# Patient Record
Sex: Female | Born: 1945 | Race: White | Hispanic: No | Marital: Married | Smoking: Never smoker
Health system: Southern US, Community
[De-identification: ages and names within clinical notes are randomized; demographics above are authoritative.]

## PROBLEM LIST (undated history)

## (undated) DIAGNOSIS — I2510 Atherosclerotic heart disease of native coronary artery without angina pectoris: Secondary | ICD-10-CM

## (undated) HISTORY — PX: ABDOMINAL HYSTERECTOMY: SHX81

## (undated) HISTORY — PX: KNEE ARTHROSCOPY: SUR90

---

## 2015-01-31 ENCOUNTER — Other Ambulatory Visit: Payer: Self-pay | Admitting: Orthopaedic Surgery

## 2015-01-31 ENCOUNTER — Ambulatory Visit
Admission: RE | Admit: 2015-01-31 | Discharge: 2015-01-31 | Disposition: A | Discharge status: Home / Self Care | Payer: PRIVATE HEALTH INSURANCE | Source: Ambulatory Visit | Attending: Orthopaedic Surgery | Admitting: Orthopaedic Surgery

## 2015-01-31 DIAGNOSIS — M25561 Pain in right knee: Secondary | ICD-10-CM

## 2015-04-09 ENCOUNTER — Emergency Department (HOSPITAL_COMMUNITY): Payer: Medicare Other

## 2015-04-09 ENCOUNTER — Encounter (HOSPITAL_COMMUNITY): Payer: Self-pay | Admitting: Emergency Medicine

## 2015-04-09 ENCOUNTER — Observation Stay (HOSPITAL_COMMUNITY)
Admission: EM | Admit: 2015-04-09 | Discharge: 2015-04-11 | Disposition: A | Discharge status: Skilled Nursing Facility | Payer: Medicare Other | Attending: Internal Medicine | Admitting: Internal Medicine

## 2015-04-09 DIAGNOSIS — M179 Osteoarthritis of knee, unspecified: Secondary | ICD-10-CM | POA: Diagnosis not present

## 2015-04-09 DIAGNOSIS — Z882 Allergy status to sulfonamides status: Secondary | ICD-10-CM | POA: Insufficient documentation

## 2015-04-09 DIAGNOSIS — Z91040 Latex allergy status: Secondary | ICD-10-CM | POA: Insufficient documentation

## 2015-04-09 DIAGNOSIS — E669 Obesity, unspecified: Secondary | ICD-10-CM | POA: Diagnosis not present

## 2015-04-09 DIAGNOSIS — Z885 Allergy status to narcotic agent status: Secondary | ICD-10-CM | POA: Insufficient documentation

## 2015-04-09 DIAGNOSIS — E119 Type 2 diabetes mellitus without complications: Secondary | ICD-10-CM

## 2015-04-09 DIAGNOSIS — R0602 Shortness of breath: Secondary | ICD-10-CM

## 2015-04-09 DIAGNOSIS — T8859XA Other complications of anesthesia, initial encounter: Secondary | ICD-10-CM | POA: Insufficient documentation

## 2015-04-09 DIAGNOSIS — I2510 Atherosclerotic heart disease of native coronary artery without angina pectoris: Secondary | ICD-10-CM | POA: Diagnosis not present

## 2015-04-09 DIAGNOSIS — G9340 Encephalopathy, unspecified: Secondary | ICD-10-CM | POA: Diagnosis not present

## 2015-04-09 DIAGNOSIS — R05 Cough: Secondary | ICD-10-CM | POA: Diagnosis not present

## 2015-04-09 DIAGNOSIS — E1140 Type 2 diabetes mellitus with diabetic neuropathy, unspecified: Secondary | ICD-10-CM | POA: Insufficient documentation

## 2015-04-09 DIAGNOSIS — T4145XA Adverse effect of unspecified anesthetic, initial encounter: Secondary | ICD-10-CM | POA: Insufficient documentation

## 2015-04-09 DIAGNOSIS — Z6834 Body mass index (BMI) 34.0-34.9, adult: Secondary | ICD-10-CM | POA: Insufficient documentation

## 2015-04-09 DIAGNOSIS — Z7984 Long term (current) use of oral hypoglycemic drugs: Secondary | ICD-10-CM | POA: Insufficient documentation

## 2015-04-09 DIAGNOSIS — Z888 Allergy status to other drugs, medicaments and biological substances status: Secondary | ICD-10-CM | POA: Insufficient documentation

## 2015-04-09 DIAGNOSIS — I10 Essential (primary) hypertension: Secondary | ICD-10-CM | POA: Diagnosis present

## 2015-04-09 DIAGNOSIS — N179 Acute kidney failure, unspecified: Secondary | ICD-10-CM | POA: Diagnosis not present

## 2015-04-09 DIAGNOSIS — D509 Iron deficiency anemia, unspecified: Secondary | ICD-10-CM | POA: Diagnosis present

## 2015-04-09 DIAGNOSIS — Z794 Long term (current) use of insulin: Secondary | ICD-10-CM | POA: Insufficient documentation

## 2015-04-09 DIAGNOSIS — R4182 Altered mental status, unspecified: Secondary | ICD-10-CM | POA: Diagnosis present

## 2015-04-09 DIAGNOSIS — R509 Fever, unspecified: Secondary | ICD-10-CM | POA: Diagnosis present

## 2015-04-09 HISTORY — DX: Atherosclerotic heart disease of native coronary artery without angina pectoris: I25.10

## 2015-04-09 LAB — I-STAT VENOUS BLOOD GAS, ED
Acid-base deficit: 1 mmol/L (ref 0.0–2.0)
Bicarbonate: 25.2 mEq/L — ABNORMAL HIGH (ref 20.0–24.0)
O2 SAT: 92 %
PCO2 VEN: 47.2 mmHg (ref 45.0–50.0)
PO2 VEN: 70 mmHg — AB (ref 30.0–45.0)
TCO2: 27 mmol/L (ref 0–100)
pH, Ven: 7.335 — ABNORMAL HIGH (ref 7.250–7.300)

## 2015-04-09 LAB — CBC WITH DIFFERENTIAL/PLATELET
Basophils Absolute: 0 10*3/uL (ref 0.0–0.1)
Basophils Relative: 0 %
Eosinophils Absolute: 0.1 10*3/uL (ref 0.0–0.7)
Eosinophils Relative: 1 %
HEMATOCRIT: 37.2 % (ref 36.0–46.0)
HEMOGLOBIN: 10.9 g/dL — AB (ref 12.0–15.0)
LYMPHS ABS: 4.3 10*3/uL — AB (ref 0.7–4.0)
LYMPHS PCT: 33 %
MCH: 22.1 pg — ABNORMAL LOW (ref 26.0–34.0)
MCHC: 29.3 g/dL — AB (ref 30.0–36.0)
MCV: 75.3 fL — ABNORMAL LOW (ref 78.0–100.0)
MONOS PCT: 8 %
Monocytes Absolute: 1 10*3/uL (ref 0.1–1.0)
NEUTROS ABS: 7.7 10*3/uL (ref 1.7–7.7)
NEUTROS PCT: 58 %
Platelets: 242 10*3/uL (ref 150–400)
RBC: 4.94 MIL/uL (ref 3.87–5.11)
RDW: 17.7 % — ABNORMAL HIGH (ref 11.5–15.5)
WBC: 13.1 10*3/uL — ABNORMAL HIGH (ref 4.0–10.5)

## 2015-04-09 LAB — COMPREHENSIVE METABOLIC PANEL
ALT: 45 U/L (ref 14–54)
ANION GAP: 10 (ref 5–15)
AST: 49 U/L — ABNORMAL HIGH (ref 15–41)
Albumin: 3.2 g/dL — ABNORMAL LOW (ref 3.5–5.0)
Alkaline Phosphatase: 101 U/L (ref 38–126)
BUN: 15 mg/dL (ref 6–20)
CALCIUM: 8.6 mg/dL — AB (ref 8.9–10.3)
CHLORIDE: 102 mmol/L (ref 101–111)
CO2: 25 mmol/L (ref 22–32)
Creatinine, Ser: 1.53 mg/dL — ABNORMAL HIGH (ref 0.44–1.00)
GFR calc non Af Amer: 34 mL/min — ABNORMAL LOW (ref >60)
GFR, EST AFRICAN AMERICAN: 39 mL/min — AB (ref >60)
Glucose, Bld: 69 mg/dL (ref 65–99)
POTASSIUM: 4.9 mmol/L (ref 3.5–5.1)
SODIUM: 137 mmol/L (ref 135–145)
Total Bilirubin: 0.5 mg/dL (ref 0.3–1.2)
Total Protein: 6.4 g/dL — ABNORMAL LOW (ref 6.5–8.1)

## 2015-04-09 LAB — URINALYSIS, ROUTINE W REFLEX MICROSCOPIC
BILIRUBIN URINE: NEGATIVE
Glucose, UA: NEGATIVE mg/dL
Hgb urine dipstick: NEGATIVE
Ketones, ur: NEGATIVE mg/dL
LEUKOCYTES UA: NEGATIVE
NITRITE: NEGATIVE
PH: 5.5 (ref 5.0–8.0)
Protein, ur: 100 mg/dL — AB
Specific Gravity, Urine: 1.014 (ref 1.005–1.030)

## 2015-04-09 LAB — I-STAT CG4 LACTIC ACID, ED
LACTIC ACID, VENOUS: 0.9 mmol/L (ref 0.5–2.0)
Lactic Acid, Venous: 0.4 mmol/L — ABNORMAL LOW (ref 0.5–2.0)

## 2015-04-09 LAB — URINE MICROSCOPIC-ADD ON

## 2015-04-09 MED ORDER — VANCOMYCIN HCL 10 G IV SOLR
1750.0000 mg | Freq: Once | INTRAVENOUS | Status: AC
  Administered 2015-04-09: 1750 mg via INTRAVENOUS
  Filled 2015-04-09: qty 1750

## 2015-04-09 MED ORDER — SODIUM CHLORIDE 0.9 % IV BOLUS (SEPSIS)
2700.0000 mL | Freq: Once | INTRAVENOUS | Status: AC
  Administered 2015-04-09: 2700 mL via INTRAVENOUS

## 2015-04-09 MED ORDER — NALOXONE HCL 0.4 MG/ML IJ SOLN
0.1000 mg | Freq: Once | INTRAMUSCULAR | Status: AC
  Administered 2015-04-09: 0.1 mg via INTRAVENOUS
  Filled 2015-04-09: qty 1

## 2015-04-09 MED ORDER — PIPERACILLIN-TAZOBACTAM 3.375 G IVPB 30 MIN
3.3750 g | Freq: Once | INTRAVENOUS | Status: AC
  Administered 2015-04-09: 3.375 g via INTRAVENOUS
  Filled 2015-04-09: qty 50

## 2015-04-09 NOTE — ED Provider Notes (Signed)
CSN: [id number]     Arrival date & time 04/09/15  1928 History   First MD Initiated Contact with Patient 04/09/15 1938     Chief Complaint  Patient presents with  . Post-op Problem     (Consider location/radiation/quality/duration/timing/severity/associated sxs/prior Treatment) Patient is a 69 y.o. female presenting with general illness. The history is provided by the patient.  Illness Severity:  Mild Onset quality:  Gradual Duration:  1 day Timing:  Constant Progression:  Worsening Chronicity:  New Associated symptoms: cough   Associated symptoms: no chest pain, no congestion, no fever, no headaches, no myalgias, no nausea, no rhinorrhea, no shortness of breath, no vomiting and no wheezing     69 yo F with a chief complaints of altered mental status. Patient has been confused and sleepy for the past 24 hours. Family states is also had a fever of 102 and half. Patient had right arthroscopic done yesterday. Orthopedics feels like this is likely secondary to narcotic overdose. Patient denies any congestion but has a mild cough. Denies dysuria. Family states she's only repeat once in the past 24 hours. Has a history of one kidney.   Past Medical History  Diagnosis Date  . Coronary artery disease    Past Surgical History  Procedure Laterality Date  . Knee arthroscopy Right   . Abdominal hysterectomy     No family history on file. Social History  Substance Use Topics  . Smoking status: Never Smoker   . Smokeless tobacco: None  . Alcohol Use: No   OB History    No data available     Review of Systems  Constitutional: Positive for activity change. Negative for fever and chills.  HENT: Negative for congestion and rhinorrhea.   Eyes: Negative for redness and visual disturbance.  Respiratory: Positive for cough. Negative for shortness of breath and wheezing.   Cardiovascular: Negative for chest pain and palpitations.  Gastrointestinal: Negative for nausea and vomiting.   Genitourinary: Negative for dysuria and urgency.  Musculoskeletal: Negative for myalgias and arthralgias.  Skin: Negative for pallor and wound.  Neurological: Negative for dizziness and headaches.      Allergies  Byetta 10 mcg pen; Codeine; Sulfa antibiotics; Tramadol; Ace inhibitors; Adhesive; Iodinated diagnostic agents; and Latex  Home Medications   Prior to Admission medications   Medication Sig Start Date End Date Taking? Authorizing Provider  albuterol (PROVENTIL HFA;VENTOLIN HFA) 108 (90 BASE) MCG/ACT inhaler Inhale 2 puffs into the lungs every 6 (six) hours as needed for wheezing or shortness of breath.   Yes Historical Provider, MD  alfuzosin (UROXATRAL) 10 MG 24 hr tablet Take 10 mg by mouth every evening.   Yes Historical Provider, MD  amLODipine (NORVASC) 5 MG tablet Take 5 mg by mouth daily.   Yes Historical Provider, MD  clorazepate (TRANXENE) 15 MG tablet Take 15 mg by mouth 2 (two) times daily.   Yes Historical Provider, MD  colestipol (COLESTID) 1 G tablet Take 1 g by mouth daily.   Yes Historical Provider, MD  esomeprazole (NEXIUM) 40 MG capsule Take 40 mg by mouth daily at 12 noon.   Yes Historical Provider, MD  gabapentin (NEURONTIN) 300 MG capsule Take 1,200 mg by mouth 2 (two) times daily.   Yes Historical Provider, MD  glimepiride (AMARYL) 4 MG tablet Take 4 mg by mouth 2 (two) times daily.   Yes Historical Provider, MD  hydrALAZINE (APRESOLINE) 25 MG tablet Take 25 mg by mouth 2 (two) times daily.   Yes Historical  Provider, MD  ibuprofen (ADVIL,MOTRIN) 200 MG tablet Take 200 mg by mouth every 6 (six) hours as needed.   Yes Historical Provider, MD  insulin glargine (LANTUS) 100 UNIT/ML injection Inject 25 Units into the skin at bedtime.   Yes Historical Provider, MD  losartan (COZAAR) 25 MG tablet Take 25 mg by mouth daily.   Yes Historical Provider, MD  metFORMIN (GLUCOPHAGE) 500 MG tablet Take 500 mg by mouth 2 (two) times daily with a meal.   Yes Historical  Provider, MD  mirabegron ER (MYRBETRIQ) 50 MG TB24 tablet Take 50 mg by mouth every evening.   Yes Historical Provider, MD  oxycodone (OXY-IR) 5 MG capsule Take 5 mg by mouth once.   Yes Historical Provider, MD  oxyCODONE-acetaminophen (PERCOCET/ROXICET) 5-325 MG tablet Take 1 tablet by mouth every 6 (six) hours.   Yes Historical Provider, MD  propranolol (INDERAL) 80 MG tablet Take 80 mg by mouth 2 (two) times daily.   Yes Historical Provider, MD  sitaGLIPtin (JANUVIA) 100 MG tablet Take 50 mg by mouth daily.   Yes Historical Provider, MD  ursodiol (ACTIGALL) 250 MG tablet Take 250 mg by mouth 2 (two) times daily.   Yes Historical Provider, MD   BP 132/64 mmHg  Pulse 86  Temp(Src) 98.3 F (36.8 C) (Oral)  Resp 18  Ht 5\' 4"  (1.626 m)  Wt 200 lb (90.719 kg)  BMI 34.31 kg/m2  SpO2 94% Physical Exam  Constitutional: She is oriented to person, place, and time. She appears well-developed and well-nourished. No distress.  HENT:  Head: Normocephalic and atraumatic.  Eyes: EOM are normal. Pupils are equal, round, and reactive to light.  Neck: Normal range of motion. Neck supple.  Cardiovascular: Normal rate and regular rhythm.  Exam reveals no gallop and no friction rub.   No murmur heard. Pulmonary/Chest: Effort normal. She has no wheezes. She has no rales.  Abdominal: Soft. She exhibits no distension. There is no tenderness. There is no rebound and no guarding.  Musculoskeletal: She exhibits no edema or tenderness.  Neurological: She is alert and oriented to person, place, and time.  Skin: Skin is warm and dry. She is not diaphoretic.  Psychiatric: She has a normal mood and affect. Her behavior is normal.  Nursing note and vitals reviewed.   ED Course  Procedures (including critical care time) Labs Review Labs Reviewed  COMPREHENSIVE METABOLIC PANEL - Abnormal; Notable for the following:    Creatinine, Ser 1.53 (*)    Calcium 8.6 (*)    Total Protein 6.4 (*)    Albumin 3.2 (*)     AST 49 (*)    GFR calc non Af Amer 34 (*)    GFR calc Af Amer 39 (*)    All other components within normal limits  CBC WITH DIFFERENTIAL/PLATELET - Abnormal; Notable for the following:    WBC 13.1 (*)    Hemoglobin 10.9 (*)    MCV 75.3 (*)    MCH 22.1 (*)    MCHC 29.3 (*)    RDW 17.7 (*)    Lymphs Abs 4.3 (*)    All other components within normal limits  URINALYSIS, ROUTINE W REFLEX MICROSCOPIC (NOT AT [facility]) - Abnormal; Notable for the following:    Protein, ur 100 (*)    All other components within normal limits  URINE MICROSCOPIC-ADD ON - Abnormal; Notable for the following:    Squamous Epithelial / LPF 0-5 (*)    Bacteria, UA RARE (*)  Casts HYALINE CASTS (*)    All other components within normal limits  I-STAT VENOUS BLOOD GAS, ED - Abnormal; Notable for the following:    pH, Ven 7.335 (*)    pO2, Ven 70.0 (*)    Bicarbonate 25.2 (*)    All other components within normal limits  I-STAT CG4 LACTIC ACID, ED - Abnormal; Notable for the following:    Lactic Acid, Venous 0.40 (*)    All other components within normal limits  CULTURE, BLOOD (ROUTINE X 2)  CULTURE, BLOOD (ROUTINE X 2)  URINE CULTURE  BLOOD GAS, VENOUS  I-STAT CG4 LACTIC ACID, ED    Imaging Review Dg Chest 2 View  04/09/2015  CLINICAL DATA:  Acute onset of cough. Weakness and tiredness. Recent knee surgery. Initial encounter. EXAM: CHEST  2 VIEW COMPARISON:  None. FINDINGS: The lungs are well-aerated. Mild vascular congestion is noted. There is no evidence of focal opacification, pleural effusion or pneumothorax. The heart is borderline normal in size. No acute osseous abnormalities are seen. IMPRESSION: Mild vascular congestion noted.  Lungs remain grossly clear. Electronically Signed   By: Roanna Raider M.D.   On: 04/09/2015 20:18   I have personally reviewed and evaluated these images and lab results as part of my medical decision-making.   EKG Interpretation   Date/Time:  Tuesday April 09 2015  19:37:10 EST Ventricular Rate:  91 PR Interval:  168 QRS Duration: 86 QT Interval:  349 QTC Calculation: 429 R Axis:   60 Text Interpretation:  Sinus rhythm No old tracing to compare Confirmed by  Aveer Bartow MD, DANIEL [phone]) on 04/09/2015 9:23:22 PM      MDM   Final diagnoses:  Complication of anesthesia, initial encounter    69 yo F with a chief complaint of altered mental status. Patient febrile and hypoxic at home. Family states is been really difficult to get her around the house. This is likely secondary to over narcotic use or residual anesthesia. Patient has a history of one kidney so may be having a longer time processing. Patient hypoxic on room air. Placed on oxygen with good improvement. Mildly tired on exam repetitive questioning. Code sepsis was initiated secondary to fever altered mental status and hypoxia. Patient was given back and Zosyn. Chest x-ray with mild edema potentially consistent with atelectasis.   Patient continues to be sleepy on exam. I seems confused whenever she really wakes up and I'm in the room. Will trial small amounts of Narcan however with patient having taking multiple doses of oral pain medicine this will certainly outlast any dose of Narcan. We'll admit for obs.  The patients results and plan were reviewed and discussed.   Any x-rays performed were independently reviewed by myself.   Differential diagnosis were considered with the presenting HPI.  Medications  vancomycin (VANCOCIN) 1,750 mg in sodium chloride 0.9 % 500 mL IVPB (1,750 mg Intravenous New Bag/Given 04/09/15 2026)  piperacillin-tazobactam (ZOSYN) IVPB 3.375 g (0 g Intravenous Stopped 04/09/15 2026)  sodium chloride 0.9 % bolus 2,700 mL (0 mLs Intravenous Stopped 04/09/15 2230)  naloxone [facility]) injection 0.1 mg (0.1 mg Intravenous Given 04/09/15 2319)    Filed Vitals:   04/09/15 2115 04/09/15 2200 04/09/15 2215 04/09/15 2300  BP:  118/62 116/58 132/64  Pulse: 88 86 85 86  Temp:    98.3 F (36.8 C)   TempSrc:   Oral   Resp: Height:      Weight:  SpO2: 96% 94% 96% 94%    Final diagnoses:  Complication of anesthesia, initial encounter    Admission/ observation were discussed with the admitting physician, patient and/or family and they are comfortable with the plan.    Melene Plan, DO 04/09/15 2327

## 2015-04-09 NOTE — ED Notes (Signed)
Pt arrives via EMS with c/o drowsiness/dazed since today, pt had [facility]. Per EMS, pt febrile at 103. Oral temp 99.8. Pt's RA 83%. Rectal temp 100.1

## 2015-04-09 NOTE — ED Notes (Addendum)
Dr. Ophelia CharterYates called me and advised me that the patient just had an arthroscopy of the knee done.  Dr. Ophelia CharterYates says he thinks the patient is taking too many narcotics.  He also said she should get some narcan to reverse some of the narcotics effects.  He did say she is weak, had a 102.00 temperature, and cannot walk because she is falling asleep.  He advised she was being brought by ambulance.  For any questions, Dr. Ophelia CharterYates provided me with his cell number [phone].

## 2015-04-10 ENCOUNTER — Observation Stay (HOSPITAL_COMMUNITY): Payer: Medicare Other

## 2015-04-10 DIAGNOSIS — E118 Type 2 diabetes mellitus with unspecified complications: Secondary | ICD-10-CM

## 2015-04-10 DIAGNOSIS — G9340 Encephalopathy, unspecified: Secondary | ICD-10-CM | POA: Diagnosis present

## 2015-04-10 DIAGNOSIS — N179 Acute kidney failure, unspecified: Secondary | ICD-10-CM | POA: Diagnosis present

## 2015-04-10 DIAGNOSIS — I10 Essential (primary) hypertension: Secondary | ICD-10-CM | POA: Diagnosis present

## 2015-04-10 DIAGNOSIS — D509 Iron deficiency anemia, unspecified: Secondary | ICD-10-CM | POA: Diagnosis present

## 2015-04-10 DIAGNOSIS — R509 Fever, unspecified: Secondary | ICD-10-CM | POA: Diagnosis present

## 2015-04-10 DIAGNOSIS — E119 Type 2 diabetes mellitus without complications: Secondary | ICD-10-CM

## 2015-04-10 LAB — BASIC METABOLIC PANEL
ANION GAP: 7 (ref 5–15)
BUN: 13 mg/dL (ref 6–20)
CALCIUM: 8 mg/dL — AB (ref 8.9–10.3)
CO2: 25 mmol/L (ref 22–32)
CREATININE: 1.21 mg/dL — AB (ref 0.44–1.00)
Chloride: 108 mmol/L (ref 101–111)
GFR, EST AFRICAN AMERICAN: 52 mL/min — AB (ref >60)
GFR, EST NON AFRICAN AMERICAN: 45 mL/min — AB (ref >60)
Glucose, Bld: 159 mg/dL — ABNORMAL HIGH (ref 65–99)
Potassium: 4.3 mmol/L (ref 3.5–5.1)
SODIUM: 140 mmol/L (ref 135–145)

## 2015-04-10 LAB — CBC WITH DIFFERENTIAL/PLATELET
BASOS ABS: 0 10*3/uL (ref 0.0–0.1)
Basophils Relative: 0 %
EOS ABS: 0.1 10*3/uL (ref 0.0–0.7)
Eosinophils Relative: 1 %
HEMATOCRIT: 33.4 % — AB (ref 36.0–46.0)
Hemoglobin: 9.9 g/dL — ABNORMAL LOW (ref 12.0–15.0)
LYMPHS ABS: 2.2 10*3/uL (ref 0.7–4.0)
LYMPHS PCT: 24 %
MCH: 22.1 pg — ABNORMAL LOW (ref 26.0–34.0)
MCHC: 29.6 g/dL — AB (ref 30.0–36.0)
MCV: 74.7 fL — ABNORMAL LOW (ref 78.0–100.0)
MONO ABS: 1 10*3/uL (ref 0.1–1.0)
Monocytes Relative: 11 %
NEUTROS ABS: 5.9 10*3/uL (ref 1.7–7.7)
Neutrophils Relative %: 64 %
PLATELETS: 219 10*3/uL (ref 150–400)
RBC: 4.47 MIL/uL (ref 3.87–5.11)
RDW: 18 % — AB (ref 11.5–15.5)
WBC: 9.2 10*3/uL (ref 4.0–10.5)

## 2015-04-10 LAB — GLUCOSE, CAPILLARY
GLUCOSE-CAPILLARY: 89 mg/dL (ref 65–99)
GLUCOSE-CAPILLARY: 161 mg/dL — AB (ref 65–99)
GLUCOSE-CAPILLARY: 123 mg/dL — AB (ref 65–99)
GLUCOSE-CAPILLARY: 134 mg/dL — AB (ref 65–99)
GLUCOSE-CAPILLARY: 123 mg/dL — AB (ref 65–99)
Glucose-Capillary: 61 mg/dL — ABNORMAL LOW (ref 65–99)
Glucose-Capillary: 216 mg/dL — ABNORMAL HIGH (ref 65–99)

## 2015-04-10 LAB — C-REACTIVE PROTEIN: CRP: 1 mg/dL — AB (ref <1.0)

## 2015-04-10 LAB — SEDIMENTATION RATE: SED RATE: 43 mm/h — AB (ref 0–22)

## 2015-04-10 MED ORDER — ONDANSETRON HCL 4 MG/2ML IJ SOLN: 4.0000 mg | Freq: Four times a day (QID) | INTRAMUSCULAR | Status: DC | PRN

## 2015-04-10 MED ORDER — ONDANSETRON HCL 4 MG PO TABS: 4.0000 mg | ORAL_TABLET | Freq: Four times a day (QID) | ORAL | Status: DC | PRN

## 2015-04-10 MED ORDER — PROPRANOLOL HCL 80 MG PO TABS
80.0000 mg | ORAL_TABLET | Freq: Two times a day (BID) | ORAL | Status: DC
  Administered 2015-04-10 – 2015-04-11 (×4): 80 mg via ORAL
  Filled 2015-04-10 (×5): qty 1

## 2015-04-10 MED ORDER — PANTOPRAZOLE SODIUM 40 MG PO TBEC
40.0000 mg | DELAYED_RELEASE_TABLET | Freq: Every day | ORAL | Status: DC
  Administered 2015-04-10 – 2015-04-11 (×2): 40 mg via ORAL
  Filled 2015-04-10 (×2): qty 1

## 2015-04-10 MED ORDER — AMLODIPINE BESYLATE 5 MG PO TABS
5.0000 mg | ORAL_TABLET | Freq: Every day | ORAL | Status: DC
  Administered 2015-04-10 – 2015-04-11 (×2): 5 mg via ORAL
  Filled 2015-04-10 (×2): qty 1

## 2015-04-10 MED ORDER — DOCUSATE SODIUM 100 MG PO CAPS
100.0000 mg | ORAL_CAPSULE | Freq: Every day | ORAL | Status: DC
  Administered 2015-04-10: 100 mg via ORAL
  Filled 2015-04-10 (×2): qty 1

## 2015-04-10 MED ORDER — ACETAMINOPHEN 325 MG PO TABS
650.0000 mg | ORAL_TABLET | Freq: Four times a day (QID) | ORAL | Status: DC | PRN
Start: 2015-04-10 — End: 2015-04-11
  Administered 2015-04-10 – 2015-04-11 (×5): 650 mg via ORAL
  Filled 2015-04-10 (×5): qty 2

## 2015-04-10 MED ORDER — HYDRALAZINE HCL 25 MG PO TABS
25.0000 mg | ORAL_TABLET | Freq: Two times a day (BID) | ORAL | Status: DC
  Administered 2015-04-10 – 2015-04-11 (×4): 25 mg via ORAL
  Filled 2015-04-10 (×4): qty 1

## 2015-04-10 MED ORDER — ALFUZOSIN HCL ER 10 MG PO TB24
10.0000 mg | ORAL_TABLET | Freq: Every evening | ORAL | Status: DC
  Administered 2015-04-10 – 2015-04-11 (×2): 10 mg via ORAL
  Filled 2015-04-10 (×2): qty 1

## 2015-04-10 MED ORDER — CLORAZEPATE DIPOTASSIUM 3.75 MG PO TABS
15.0000 mg | ORAL_TABLET | Freq: Two times a day (BID) | ORAL | Status: DC
  Administered 2015-04-10 – 2015-04-11 (×4): 15 mg via ORAL
  Filled 2015-04-10 (×5): qty 4

## 2015-04-10 MED ORDER — COLESTIPOL HCL 1 G PO TABS
1.0000 g | ORAL_TABLET | Freq: Every day | ORAL | Status: DC
  Administered 2015-04-10 – 2015-04-11 (×2): 1 g via ORAL
  Filled 2015-04-10 (×2): qty 1

## 2015-04-10 MED ORDER — ACETAMINOPHEN 650 MG RE SUPP: 650.0000 mg | Freq: Four times a day (QID) | RECTAL | Status: DC | PRN

## 2015-04-10 MED ORDER — ENOXAPARIN SODIUM 40 MG/0.4ML ~~LOC~~ SOLN
40.0000 mg | SUBCUTANEOUS | Status: DC
  Administered 2015-04-10 – 2015-04-11 (×2): 40 mg via SUBCUTANEOUS
  Filled 2015-04-10 (×2): qty 0.4

## 2015-04-10 MED ORDER — LOSARTAN POTASSIUM 25 MG PO TABS
25.0000 mg | ORAL_TABLET | Freq: Every day | ORAL | Status: DC
  Administered 2015-04-10 – 2015-04-11 (×2): 25 mg via ORAL
  Filled 2015-04-10 (×2): qty 1

## 2015-04-10 MED ORDER — ALBUTEROL SULFATE (2.5 MG/3ML) 0.083% IN NEBU
2.5000 mg | INHALATION_SOLUTION | Freq: Four times a day (QID) | RESPIRATORY_TRACT | Status: DC
  Administered 2015-04-10 (×2): 2.5 mg via RESPIRATORY_TRACT
  Filled 2015-04-10 (×2): qty 3

## 2015-04-10 MED ORDER — ALBUTEROL SULFATE (2.5 MG/3ML) 0.083% IN NEBU
2.5000 mg | INHALATION_SOLUTION | RESPIRATORY_TRACT | Status: DC | PRN
Start: 2015-04-10 — End: 2015-04-11

## 2015-04-10 MED ORDER — INSULIN ASPART 100 UNIT/ML ~~LOC~~ SOLN
0.0000 [IU] | SUBCUTANEOUS | Status: DC
  Administered 2015-04-10 (×2): 1 [IU] via SUBCUTANEOUS
  Administered 2015-04-10: 3 [IU] via SUBCUTANEOUS
  Administered 2015-04-11: 1 [IU] via SUBCUTANEOUS
  Administered 2015-04-11: 2 [IU] via SUBCUTANEOUS
  Administered 2015-04-11: 3 [IU] via SUBCUTANEOUS

## 2015-04-10 MED ORDER — URSODIOL 300 MG PO CAPS
250.0000 mg | ORAL_CAPSULE | Freq: Two times a day (BID) | ORAL | Status: DC
  Administered 2015-04-10 – 2015-04-11 (×4): 300 mg via ORAL
  Filled 2015-04-10 (×7): qty 1

## 2015-04-10 MED ORDER — IPRATROPIUM BROMIDE 0.02 % IN SOLN
0.5000 mg | Freq: Four times a day (QID) | RESPIRATORY_TRACT | Status: DC
Start: 2015-04-10 — End: 2015-04-10
  Administered 2015-04-10 (×2): 0.5 mg via RESPIRATORY_TRACT
  Filled 2015-04-10 (×2): qty 2.5

## 2015-04-10 MED ORDER — SODIUM CHLORIDE 0.9 % IV SOLN: INTRAVENOUS | Status: DC

## 2015-04-10 MED ORDER — MIRABEGRON ER 25 MG PO TB24
50.0000 mg | ORAL_TABLET | Freq: Every evening | ORAL | Status: DC
  Administered 2015-04-10 – 2015-04-11 (×2): 50 mg via ORAL
  Filled 2015-04-10 (×3): qty 2

## 2015-04-10 MED ORDER — IPRATROPIUM-ALBUTEROL 0.5-2.5 (3) MG/3ML IN SOLN
3.0000 mL | Freq: Four times a day (QID) | RESPIRATORY_TRACT | Status: DC
  Administered 2015-04-10: 3 mL via RESPIRATORY_TRACT
  Filled 2015-04-10: qty 3

## 2015-04-10 NOTE — Evaluation (Signed)
Physical Therapy Evaluation Patient Details Name: Tammy Smith MRN: [id number] DOB: 06/24/1945 Today's Date: 04/10/2015   History of Present Illness  Patient is a 69 year old female with past medical history significant for coronary artery disease, solitary kidney, diabetes mellitus type 2, hypertension, and osteoarthritis of the knee; who presents acutely for altered mental status. History is provided by the patient's husband she is still altered and unable to give all history herself. They note that she had a right knee arthroscopy performed yesterday by Dr. Annell GreeningMark Yates.  Clinical Impression  Pt admitted with above diagnosis. Pt currently with functional limitations due to the deficits listed below (see PT Problem List). Pt's cognition much improved from admission but she continues to have decreased response time in following commands and needs vc's with sequencing. Currently requiring +2 mod A to get out of bed and min A +2 for safe ambulation. Would really benefit from post acute rehab. Pt will benefit from skilled PT to increase their independence and safety with mobility to allow discharge to the venue listed below.       Follow Up Recommendations SNF;Supervision/Assistance - 24 hour,(if this is not an option, recommend HHPT/ OT)    Equipment Recommendations  None recommended by PT    Recommendations for Other Services       Precautions / Restrictions Precautions Precautions: Fall;Knee Precaution Booklet Issued: No Precaution Comments: reviewed proper positioning of knee Restrictions Weight Bearing Restrictions: No      Mobility  Bed Mobility Overal bed mobility: Needs Assistance;+2 for physical assistance Bed Mobility: Supine to Sit     Supine to sit: Mod assist;+2 for physical assistance     General bed mobility comments: mod A +2 to help pt slide legs and hips to EOB as well as elevate trunk to achieve sitting. Mod A to scoot hips to EOB  Transfers Overall  transfer level: Needs assistance Equipment used: Rolling walker (2 wheeled) Transfers: Sit to/from Stand Sit to Stand: Mod assist;+2 physical assistance         General transfer comment: +2 mod for power up and vc's for hand placement  Ambulation/Gait Ambulation/Gait assistance: Min assist;+2 safety/equipment Ambulation Distance (Feet): 12 Feet Assistive device: Rolling walker (2 wheeled) Gait Pattern/deviations: Step-to pattern;Decreased stance time - right;Decreased step length - right;Decreased weight shift to right Gait velocity: decreased Gait velocity interpretation: Below normal speed for age/gender General Gait Details: right knee buckling during stance phase of ambulation, improved with distance. vc's for erect posture. Min A to steady and +2 for chair behind pt  Stairs            Wheelchair Mobility    Modified Rankin (Stroke Patients Only)       Balance Overall balance assessment: Needs assistance Sitting-balance support: Bilateral upper extremity supported Sitting balance-Leahy Scale: Poor     Standing balance support: Bilateral upper extremity supported;During functional activity Standing balance-Leahy Scale: Poor Standing balance comment: increased wt through bilateral UE's to maintain standing                             Pertinent Vitals/Pain Pain Assessment: Faces Faces Pain Scale: Hurts little more Pain Location: right knee with motion Pain Descriptors / Indicators: Aching Pain Intervention(s): Limited activity within patient's tolerance;Monitored during session;Repositioned    Home Living Family/patient expects to be discharged to:: Private residence Living Arrangements: Spouse/significant other;Children Available Help at Discharge: Family;Available PRN/intermittently Type of Home: House Home Access: Stairs to enter Entrance Stairs-Rails:  Right Entrance Stairs-Number of Steps: 3 Home Layout: One level Home Equipment: Walker - 2  wheels;Bedside commode Additional Comments: above information is for youngest son's home, this is where she was staying before her surgery. She and her husband live in a 27' RV with steps to get in and up to bedroom.     Prior Function Level of Independence: Needs assistance   Gait / Transfers Assistance Needed: pt was having difficulty with stairs and uneven surfaces as well as overall decreased mobility before arthoscopy     Comments: son in [place] is a HHPT     Hand Dominance        Extremity/Trunk Assessment   Upper Extremity Assessment: Overall WFL for tasks assessed           Lower Extremity Assessment: RLE deficits/detail RLE Deficits / Details: right knee buckling in standing, 2/5 knee flexion strength, hip flex 2/5    Cervical / Trunk Assessment: Kyphotic  Communication   Communication: No difficulties  Cognition Arousal/Alertness: Lethargic Behavior During Therapy: WFL for tasks assessed/performed Overall Cognitive Status: Impaired/Different from baseline Area of Impairment: Following commands;Safety/judgement;Problem solving     Memory: Decreased short-term memory Following Commands: Follows one step commands consistently;Follows one step commands with increased time;Follows multi-step commands with increased time     Problem Solving: Slow processing;Requires verbal cues General Comments: pt much improved from yesterday per her and her family and she is oriented x4 today but she is still with decreased response time and seems somewhat lethargic    General Comments General comments (skin integrity, edema, etc.): O2 sats 93% on RA, HR 85 bpm    Exercises General Exercises - Lower Extremity Ankle Circles/Pumps: AROM;Both;10 reps;Seated Quad Sets: AROM;Both;10 reps;Seated Long Arc Quad: AAROM;Left;10 reps;Seated Heel Slides: AAROM;Left;10 reps;Seated      Assessment/Plan    PT Assessment Patient needs continued PT services  PT Diagnosis Difficulty  walking;Abnormality of gait;Acute pain   PT Problem List Decreased strength;Decreased range of motion;Decreased activity tolerance;Decreased balance;Decreased mobility;Decreased coordination;Decreased knowledge of use of DME;Decreased knowledge of precautions;Pain  PT Treatment Interventions DME instruction;Gait training;Stair training;Functional mobility training;Therapeutic activities;Balance training;Therapeutic exercise;Neuromuscular re-education;Cognitive remediation;Patient/family education   PT Goals (Current goals can be found in the Care Plan section) Acute Rehab PT Goals Patient Stated Goal: return to walking PT Goal Formulation: With patient/family Time For Goal Achievement: 04/17/15 Potential to Achieve Goals: Good    Frequency Min 5X/week   Barriers to discharge Inaccessible home environment her home is inaccessible, son's home is better but he was not present on eval to discuss this    Co-evaluation               End of Session Equipment Utilized During Treatment: Gait belt Activity Tolerance: Patient tolerated treatment well Patient left: in chair;with call bell/phone within reach;with family/visitor present Nurse Communication: Mobility status    Functional Assessment Tool Used: clinical judgement Functional Limitation: Mobility: Walking and moving around Mobility: Walking and Moving Around Current Status [phone]): At least 40 percent but less than 60 percent impaired, limited or restricted Mobility: Walking and Moving Around Goal Status [phone]): At least 1 percent but less than 20 percent impaired, limited or restricted    Time: 1002-1050 PT Time Calculation (min) (ACUTE ONLY): 48 min   Charges:   PT Evaluation $Initial PT Evaluation Tier I: 1 Procedure PT Treatments $Gait Training: 8-22 mins $Therapeutic Activity: 8-22 mins   PT G Codes:   PT G-Codes **NOT FOR INPATIENT CLASS** Functional Assessment Tool Used: clinical  judgement Functional Limitation:  Mobility: Walking and moving around Mobility: Walking and Moving Around Current Status [phone]): At least 40 percent but less than 60 percent impaired, limited or restricted Mobility: Walking and Moving Around Goal Status [phone]): At least 1 percent but less than 20 percent impaired, limited or restricted   Lyanne Co, PT  Acute Rehab Services  [phone]  Lyanne Co 04/10/2015, 12:01 PM

## 2015-04-10 NOTE — NC FL2 (Signed)
[place] MEDICAID FL2 LEVEL OF CARE SCREENING TOOL     IDENTIFICATION  Patient Name: Tammy Smith Birthdate: 22-Nov-1945 Sex: female Admission Date (Current Location): 04/09/2015  [facility] and [place] Number: Runner, broadcasting/film/video and Address:  [facility], [address] [place], [place] [address]      Provider Number: [id number]  Attending Physician Name and Address:  Dorothea Ogle, MD  Relative Name and Phone Number:       Current Level of Care: Hospital Recommended Level of Care: Skilled Nursing Facility Prior Approval Number:    Date Approved/Denied:   PASRR Number: [id number]  Discharge Plan: SNF    Current Diagnoses: Patient Active Problem List   Diagnosis Date Noted  . Acute encephalopathy 04/10/2015  . AKI (acute kidney injury) (HCC) 04/10/2015  . Essential hypertension 04/10/2015  . Diabetes mellitus (HCC) 04/10/2015  . Fever, unspecified 04/10/2015  . Microcytic hypochromic anemia 04/10/2015    Orientation ACTIVITIES/SOCIAL BLADDER RESPIRATION    Self, Time, Situation, Place  Family supportive Continent O2 (As needed) (2L [place])  BEHAVIORAL SYMPTOMS/MOOD NEUROLOGICAL BOWEL NUTRITION STATUS      Continent    PHYSICIAN VISITS COMMUNICATION OF NEEDS Height & Weight Skin    Verbally  (162.6 cm) 200 lbs. Surgical wounds          AMBULATORY STATUS RESPIRATION    Assist extensive O2 (As needed) (2L [place])      Personal Care Assistance Level of Assistance  Bathing, Dressing Bathing Assistance: Maximum assistance   Dressing Assistance: Maximum assistance      Functional Limitations Info                SPECIAL CARE FACTORS FREQUENCY  PT (By licensed PT)     PT Frequency: 5/wk             Additional Factors Info  Code Status, Allergies, Insulin Sliding Scale Code Status Info: FULL Allergies Info: byetta 10 mcg pen, codeine, sulf antibioitcs, tramadol, ace inhibitors, adhesive, iodinated dianostic agents,  latex   Insulin Sliding Scale Info: 6/day       Current Medications (04/10/2015):  This is the current hospital active medication list Current Facility-Administered Medications  Medication Dose Route Frequency Provider Last Rate Last Dose  . acetaminophen (TYLENOL) tablet 650 mg  650 mg Oral Q6H PRN Clydie Braun, MD   650 mg at 04/10/15 0841   Or  . acetaminophen (TYLENOL) suppository 650 mg  650 mg Rectal Q6H PRN Clydie Braun, MD      . albuterol (PROVENTIL) (2.5 MG/3ML) 0.083% nebulizer solution 2.5 mg  2.5 mg Nebulization Q2H PRN Clydie Braun, MD      . alfuzosin (UROXATRAL) 24 hr tablet 10 mg  10 mg Oral QPM Clydie Braun, MD   10 mg at 04/10/15 1645  . amLODipine (NORVASC) tablet 5 mg  5 mg Oral Daily Clydie Braun, MD   5 mg at 04/10/15 0839  . clorazepate (TRANXENE) tablet 15 mg  15 mg Oral BID Clydie Braun, MD   15 mg at 04/10/15 0840  . colestipol (COLESTID) tablet 1 g  1 g Oral Daily Clydie Braun, MD   1 g at 04/10/15 1045  . docusate sodium (COLACE) capsule 100 mg  100 mg Oral Daily Clydie Braun, MD   100 mg at 04/10/15 0841  . enoxaparin (LOVENOX) injection 40 mg  40 mg Subcutaneous Q24H Rondell Burtis Junes, MD   40 mg  at 04/10/15 1257  . hydrALAZINE (APRESOLINE) tablet 25 mg  25 mg Oral BID Clydie Braunondell A Smith, MD   25 mg at 04/10/15 0839  . insulin aspart (novoLOG) injection 0-9 Units  0-9 Units Subcutaneous 6 times per day Clydie Braunondell A Smith, MD   1 Units at 04/10/15 1257  . ipratropium-albuterol (DUONEB) 0.5-2.5 (3) MG/3ML nebulizer solution 3 mL  3 mL Nebulization Q6H Dorothea OgleIskra M Myers, MD   3 mL at 04/10/15 1400  . losartan (COZAAR) tablet 25 mg  25 mg Oral Daily Clydie Braunondell A Smith, MD   25 mg at 04/10/15 0839  . mirabegron ER (MYRBETRIQ) tablet 50 mg  50 mg Oral QPM Clydie Braunondell A Smith, MD   50 mg at 04/10/15 1646  . ondansetron (ZOFRAN) tablet 4 mg  4 mg Oral Q6H PRN Clydie Braunondell A Smith, MD       Or  . ondansetron (ZOFRAN) injection 4 mg  4 mg Intravenous Q6H PRN Rondell A  Katrinka BlazingSmith, MD      . pantoprazole (PROTONIX) EC tablet 40 mg  40 mg Oral Daily Clydie Braunondell A Smith, MD   40 mg at 04/10/15 0839  . propranolol (INDERAL) tablet 80 mg  80 mg Oral BID Clydie Braunondell A Smith, MD   80 mg at 04/10/15 1044  . ursodiol (ACTIGALL) capsule 300 mg  300 mg Oral BID Clydie Braunondell A Smith, MD   300 mg at 04/10/15 1522     Discharge Medications: Please see discharge summary for a list of discharge medications.  Relevant Imaging Results:  Relevant Lab Results:  Recent Labs    Additional Information SS#: [id number]  [name], Mariane Burpee M, LCSW

## 2015-04-10 NOTE — Progress Notes (Signed)
Hypoglycemic Event  CBG: 61  Treatment: 15 GM carbohydrate snack  Symptoms: None  Follow-up CBG: Time:0212 CBG Result:89  Possible Reasons for Event: Inadequate meal intake  Comments/MD notified:    Elly ModenaYim, Danijela Vessey

## 2015-04-10 NOTE — H&P (Addendum)
[facility] History and Physical  Tammy Smith YQM:[id number] DOB: Jun 05, 1945 DOA: 04/09/2015  Referring physician: ED PCP: Vickii Chafe, MD   Chief Complaint: Altered mental status  HPI:  Patient is a 69 year old female with past medical history significant for coronary artery disease, solitary kidney, diabetes mellitus type 2, hypertension, and osteoarthritis of the knee; who presents acutely for altered mental status. History is provided by the patient's husband she is still altered and unable to give all history herself. They note that she had a right knee arthroscopy performed yesterday by Dr. Rodell Perna. Following the procedure which was completed around 10 AM she had been given 1 pill of oxycodone. Her husband was cared for her during this time and notes that he gave her additional pills of oxycodone 5/325 at 2 PM, 6 PM, 10 PM, and 6 AM this morning. Patient denies recollection of any of the events over the last day. Per husband states that she was getting up with assistance and appeared to be acting normal. However, patient was more sleepy and lethargic. He notes that she was not responding appropriately and sleeping the whole day. Family states that she also had associated symptoms of fever up to 102F at home and this intermittent cough. Patient's husband called Dr. Lorin Mercy office, who advised that she likely needed to be be assessed. Patient denied any symptoms of chest pain, shortness of breath, or nausea. This was not the patient's first time under anesthesia. The arthroscopic site appears to be clean with no signs of color change on the skin.  Son was also present notes that the patient had a similar occurrence in [place] back in either 2012-2013. While patient was hospitalized for multiple days with a unusual fever that was later thought to be secondary to mono. There is no report of any surgical procedure prior to this hospitalization.  In the emergency  department patient was evaluated and given Narcan as well as Empiric antibiotics of Vancomycin and  Zosyn for possible infection.  Review of Systems  Constitutional: Positive for fever and malaise/fatigue. Negative for chills.  HENT: Negative for hearing loss and tinnitus.   Eyes: Negative for photophobia and pain.  Respiratory: Positive for cough. Negative for hemoptysis and sputum production.   Cardiovascular: Negative for chest pain and palpitations.  Gastrointestinal: Negative for nausea and vomiting.  Genitourinary: Negative for urgency and frequency.  Musculoskeletal: Positive for joint pain. Negative for falls.  Skin: Negative for itching and rash.  Neurological: Negative for speech change and focal weakness.  Psychiatric/Behavioral: Positive for memory loss. The patient is not nervous/anxious.        Past Medical History  Diagnosis Date  . Coronary artery disease      Past Surgical History  Procedure Laterality Date  . Knee arthroscopy Right   . Abdominal hysterectomy        Social History:  reports that she has never smoked. She does not have any smokeless tobacco history on file. She reports that she does not drink alcohol. Her drug history is not on file. Where does patient live--home    and with whom if at home? Husband Can patient participate in ADLs? Yes  Allergies  Allergen Reactions  . Byetta 10 Mcg Pen [Exenatide] Nausea And Vomiting  . Codeine Nausea And Vomiting  . Sulfa Antibiotics Other (See Comments)    Weird feeling  . Tramadol Other (See Comments)    headache  . Ace Inhibitors Cough  . Adhesive [Tape] Itching  .  Iodinated Diagnostic Agents Rash  . Latex Itching    No family history on file.    Prior to Admission medications   Medication Sig Start Date End Date Taking? Authorizing Provider  albuterol (PROVENTIL HFA;VENTOLIN HFA) 108 (90 BASE) MCG/ACT inhaler Inhale 2 puffs into the lungs every 6 (six) hours as needed for wheezing or  shortness of breath.   Yes Historical Provider, MD  alfuzosin (UROXATRAL) 10 MG 24 hr tablet Take 10 mg by mouth every evening.   Yes Historical Provider, MD  amLODipine (NORVASC) 5 MG tablet Take 5 mg by mouth daily.   Yes Historical Provider, MD  clorazepate (TRANXENE) 15 MG tablet Take 15 mg by mouth 2 (two) times daily.   Yes Historical Provider, MD  colestipol (COLESTID) 1 G tablet Take 1 g by mouth daily.   Yes Historical Provider, MD  esomeprazole (NEXIUM) 40 MG capsule Take 40 mg by mouth daily at 12 noon.   Yes Historical Provider, MD  gabapentin (NEURONTIN) 300 MG capsule Take 1,200 mg by mouth 2 (two) times daily.   Yes Historical Provider, MD  glimepiride (AMARYL) 4 MG tablet Take 4 mg by mouth 2 (two) times daily.   Yes Historical Provider, MD  hydrALAZINE (APRESOLINE) 25 MG tablet Take 25 mg by mouth 2 (two) times daily.   Yes Historical Provider, MD  ibuprofen (ADVIL,MOTRIN) 200 MG tablet Take 200 mg by mouth every 6 (six) hours as needed.   Yes Historical Provider, MD  insulin glargine (LANTUS) 100 UNIT/ML injection Inject 25 Units into the skin at bedtime.   Yes Historical Provider, MD  losartan (COZAAR) 25 MG tablet Take 25 mg by mouth daily.   Yes Historical Provider, MD  metFORMIN (GLUCOPHAGE) 500 MG tablet Take 500 mg by mouth 2 (two) times daily with a meal.   Yes Historical Provider, MD  mirabegron ER (MYRBETRIQ) 50 MG TB24 tablet Take 50 mg by mouth every evening.   Yes Historical Provider, MD  oxycodone (OXY-IR) 5 MG capsule Take 5 mg by mouth once.   Yes Historical Provider, MD  oxyCODONE-acetaminophen (PERCOCET/ROXICET) 5-325 MG tablet Take 1 tablet by mouth every 6 (six) hours.   Yes Historical Provider, MD  propranolol (INDERAL) 80 MG tablet Take 80 mg by mouth 2 (two) times daily.   Yes Historical Provider, MD  sitaGLIPtin (JANUVIA) 100 MG tablet Take 50 mg by mouth daily.   Yes Historical Provider, MD  ursodiol (ACTIGALL) 250 MG tablet Take 250 mg by mouth 2 (two)  times daily.   Yes Historical Provider, MD     Physical Exam: Filed Vitals:   04/09/15 2200 04/09/15 2215 04/09/15 2300 04/09/15 2330  BP: 118/62 116/58 132/64 126/62  Pulse: 86 85 86 86  Temp:  98.3 F (36.8 C)    TempSrc:  Oral    Resp:  18 18 18   Height:      Weight:      SpO2: 94% 96% 94% 96%     Constitutional: Vital signs reviewed. Patient is a lethargic, but arousable to verbal command and seems somewhat confused able to state her name  Head: Normocephalic and atraumatic  Ear: TM normal bilaterally  Mouth: no erythema or exudates, MMM  Eyes: PERRL, EOMI, conjunctivae normal, No scleral icterus.  Neck: Supple, Trachea midline normal ROM, No JVD, mass, thyromegaly, or carotid bruit present.  Cardiovascular: RRR, S1 normal, S2 normal, no MRG, pulses symmetric and intact bilaterally  Pulmonary/Chest: CTAB, no wheezes, rales, or rhonchi  Abdominal: Soft. Non-tender,  non-distended, bowel sounds are normal, no masses, organomegaly, or guarding present.  GU: no CVA tenderness Musculoskeletal: No joint deformities, erythema, or stiffness, ROM full and no nontender Ext: no edema and no cyanosis, pulses palpable bilaterally (DP and PT)  Hematology: no cervical, inginal, or axillary adenopathy.  Neurological: A&O x3, Strenght is normal and symmetric bilaterally, cranial nerve II-XII are grossly intact, no focal motor deficit, sensory intact to light touch bilaterally.  Skin: Warm, dry and intact with right knee incision site appearing to show no increased redness or warmth. No rash, cyanosis, or clubbing.  Psychiatric: Normal mood and affect. speech and behavior is normal. Judgment and thought content normal. Cognition and memory are normal.      Data Review   Micro Results No results found for this or any previous visit (from the past 240 hour(s)).  Radiology Reports Dg Chest 2 View  04/09/2015  CLINICAL DATA:  Acute onset of cough. Weakness and tiredness. Recent knee  surgery. Initial encounter. EXAM: CHEST  2 VIEW COMPARISON:  None. FINDINGS: The lungs are well-aerated. Mild vascular congestion is noted. There is no evidence of focal opacification, pleural effusion or pneumothorax. The heart is borderline normal in size. No acute osseous abnormalities are seen. IMPRESSION: Mild vascular congestion noted.  Lungs remain grossly clear. Electronically Signed   By: Garald Balding M.D.   On: 04/09/2015 20:18     CBC  Recent Labs Lab 04/09/15 1945  WBC 13.1*  HGB 10.9*  HCT 37.2  PLT 242  MCV 75.3*  MCH 22.1*  MCHC 29.3*  RDW 17.7*  LYMPHSABS 4.3*  MONOABS 1.0  EOSABS 0.1  BASOSABS 0.0    Chemistries   Recent Labs Lab 04/09/15 1945  NA 137  K 4.9  CL 102  CO2 25  GLUCOSE 69  BUN 15  CREATININE 1.53*  CALCIUM 8.6*  AST 49*  ALT 45  ALKPHOS 101  BILITOT 0.5   ------------------------------------------------------------------------------------------------------------------ estimated creatinine clearance is 38.9 mL/min (by C-G formula based on Cr of 1.53). ------------------------------------------------------------------------------------------------------------------ No results for input(s): HGBA1C in the last 72 hours. ------------------------------------------------------------------------------------------------------------------ No results for input(s): CHOL, HDL, LDLCALC, TRIG, CHOLHDL, LDLDIRECT in the last 72 hours. ------------------------------------------------------------------------------------------------------------------ No results for input(s): TSH, T4TOTAL, T3FREE, THYROIDAB in the last 72 hours.  Invalid input(s): FREET3 ------------------------------------------------------------------------------------------------------------------ No results for input(s): VITAMINB12, FOLATE, FERRITIN, TIBC, IRON, RETICCTPCT in the last 72 hours.  Coagulation profile No results for input(s): INR, PROTIME in the last 168  hours.  No results for input(s): DDIMER in the last 72 hours.  Cardiac Enzymes No results for input(s): CKMB, TROPONINI, MYOGLOBIN in the last 168 hours.  Invalid input(s): CK ------------------------------------------------------------------------------------------------------------------ Invalid input(s): POCBNP   CBG: No results for input(s): GLUCAP in the last 168 hours.     EKG: Independently reviewed. Sinus rhythm   Assessment/Plan Principal Problem:    Acute encephalopathy: The exact cause of patient's symptoms is not totally clear suspect that patient history of solitary kidney with frequent doses of pain medications could have likely lead to increased lethargy. Chest x-ray showing mild vascular congestion , but lungs were relatively clear. -Holding narcotic medications Tylenol when necessary pain -Checking ESR/CRP  -Consider need of continuation of Vanc and Zosyn -Incentive spirometry -Recheck CBC  Fever, unspecified: WBC elevated at 13.1. Multiple possible sources including recent surgical procedure, pain medications, atelectasis, possible infection. UA appears to be negative for any signs of infection. -Follow up blood cultures  Recent right knee arthroscopic with gait disturbance -Physical therapy to eval and treat  in a.m.    Suspect. AKI (acute kidney injury) ([place]): Patient's baseline creatinine is unknown. Suspect with patient's increased lethargy that she would have likely decrease intake by mouth. -Gentle IV fluid hydration -Recheck BMP in a.m.  Essential hypertension -Continue home medications which include propranolol, hydralazine, losartan, Norvasc    Diabetes mellitus ([place]): patient's initial blood glucose 69. -Held oral hypoglycemics -Placed on a carb modified diet -Every 4 hours blood glucose checks with hypoglycemic protocols     Microcytic hypochromic anemia: Hemoglobin initially 10.9  with low MCV and MCH -Follow-up BMP in a.m.   Code  Status:   full Family Communication: bedside Disposition Plan: admit   Total time spent 55 minutes.Greater than 50% of this time was spent in counseling, explanation of diagnosis, planning of further management, and coordination of care  [facility] Pager [phone]  If 7PM-7AM, please contact night-coverage [email or web address] Password [email or web address] 04/10/2015, 12:17 AM

## 2015-04-10 NOTE — Progress Notes (Signed)
Patient seen and examined at bedside, admitted after midnight, please see Dr. Michaelle CopasSmith's admission note for details. Patient admitted for altered mental status thought to be secondary to narcotics. Patient mental status is cleared this morning. Low-grade fever noted T 100.1 F. Chest x-ray with no signs of pneumonia and urinalysis with no signs of urinary tract infection. Monitor vital signs closely, repeat CBC and BMP in the morning. Physical therapy evaluation requested. Possible discharge in the morning.  Debbora PrestoMAGICK-Rekha Hobbins, MD  [facility] Pager [phone]  If 7PM-7AM, please contact night-coverage [email or web address] Password [email or web address]

## 2015-04-10 NOTE — Progress Notes (Addendum)
Patient ID: Tammy Smith, female   DOB: 05/19/1945, 69 y.o.   MRN: [id number] Appreciate care provided. Acute encephalopathy/ sensitive to narcotics with decreased mental alertness. This AM awake, talkative, oriented times 3.  MAE,      She had percocet one tablet q 4-6 hrs for a total of 4 doses then unable to stay awake, could not stand, could not walk.      Getting tylenol for pain at this time.     Physical therapy to work with her this AM.

## 2015-04-11 DIAGNOSIS — T8859XA Other complications of anesthesia, initial encounter: Secondary | ICD-10-CM | POA: Insufficient documentation

## 2015-04-11 DIAGNOSIS — G9340 Encephalopathy, unspecified: Secondary | ICD-10-CM | POA: Diagnosis not present

## 2015-04-11 DIAGNOSIS — N179 Acute kidney failure, unspecified: Secondary | ICD-10-CM | POA: Diagnosis not present

## 2015-04-11 DIAGNOSIS — T4145XA Adverse effect of unspecified anesthetic, initial encounter: Secondary | ICD-10-CM | POA: Diagnosis not present

## 2015-04-11 LAB — URINE CULTURE: Culture: NO GROWTH

## 2015-04-11 LAB — GLUCOSE, CAPILLARY
GLUCOSE-CAPILLARY: 126 mg/dL — AB (ref 65–99)
GLUCOSE-CAPILLARY: 156 mg/dL — AB (ref 65–99)
GLUCOSE-CAPILLARY: 207 mg/dL — AB (ref 65–99)
Glucose-Capillary: 141 mg/dL — ABNORMAL HIGH (ref 65–99)
Glucose-Capillary: 177 mg/dL — ABNORMAL HIGH (ref 65–99)

## 2015-04-11 LAB — BASIC METABOLIC PANEL
ANION GAP: 8 (ref 5–15)
BUN: 10 mg/dL (ref 6–20)
CALCIUM: 8.7 mg/dL — AB (ref 8.9–10.3)
CO2: 28 mmol/L (ref 22–32)
Chloride: 105 mmol/L (ref 101–111)
Creatinine, Ser: 1.11 mg/dL — ABNORMAL HIGH (ref 0.44–1.00)
GFR calc Af Amer: 58 mL/min — ABNORMAL LOW (ref >60)
GFR calc non Af Amer: 50 mL/min — ABNORMAL LOW (ref >60)
GLUCOSE: 148 mg/dL — AB (ref 65–99)
Potassium: 3.7 mmol/L (ref 3.5–5.1)
Sodium: 141 mmol/L (ref 135–145)

## 2015-04-11 LAB — CBC
HEMATOCRIT: 32.1 % — AB (ref 36.0–46.0)
Hemoglobin: 9.6 g/dL — ABNORMAL LOW (ref 12.0–15.0)
MCH: 22 pg — AB (ref 26.0–34.0)
MCHC: 29.9 g/dL — ABNORMAL LOW (ref 30.0–36.0)
MCV: 73.6 fL — AB (ref 78.0–100.0)
PLATELETS: 208 10*3/uL (ref 150–400)
RBC: 4.36 MIL/uL (ref 3.87–5.11)
RDW: 17.5 % — AB (ref 11.5–15.5)
WBC: 7.5 10*3/uL (ref 4.0–10.5)

## 2015-04-11 MED ORDER — OXYCODONE-ACETAMINOPHEN 5-325 MG PO TABS: 1.0000 | ORAL_TABLET | Freq: Four times a day (QID) | ORAL | Status: AC

## 2015-04-11 MED ORDER — LEVALBUTEROL HCL 0.63 MG/3ML IN NEBU
0.6300 mg | INHALATION_SOLUTION | Freq: Two times a day (BID) | RESPIRATORY_TRACT | Status: DC
  Administered 2015-04-11: 0.63 mg via RESPIRATORY_TRACT
  Filled 2015-04-11: qty 3

## 2015-04-11 MED ORDER — GABAPENTIN 400 MG PO CAPS
1200.0000 mg | ORAL_CAPSULE | Freq: Two times a day (BID) | ORAL | Status: DC
Start: 2015-04-11 — End: 2015-04-11
  Administered 2015-04-11: 1200 mg via ORAL
  Filled 2015-04-11: qty 3

## 2015-04-11 MED ORDER — LOPERAMIDE HCL 2 MG PO CAPS
2.0000 mg | ORAL_CAPSULE | ORAL | Status: DC | PRN
  Administered 2015-04-11: 2 mg via ORAL
  Filled 2015-04-11 (×4): qty 1

## 2015-04-11 MED ORDER — DOCUSATE SODIUM 100 MG PO CAPS: 100.0000 mg | ORAL_CAPSULE | Freq: Every day | ORAL | Status: AC

## 2015-04-11 NOTE — Progress Notes (Signed)
Physical Therapy Treatment Patient Details Name: Tammy Smith MRN: [id number] DOB: 1945-06-10 Today's Date: 04/11/2015    History of Present Illness Patient is a 69 year old female with past medical history significant for coronary artery disease, solitary kidney, diabetes mellitus type 2, hypertension, and osteoarthritis of the knee; who presents acutely for altered mental status. History is provided by the patient's husband she is still altered and unable to give all history herself. They note that she had a right knee arthroscopy performed 04/08/15 by Dr. Annell Greening.    PT Comments    Pt/family requested second session today to initiate stair training as this is one of her biggest obstacles to getting home safely.  Pt did well, min assist on a 6" step, but her steps to get into her RV are at least twice that height.  Pt plans to d/c to SNF later today for continued rehab.  PT will follow acutely until d/c confirmed.   Follow Up Recommendations  SNF     Equipment Recommendations  None recommended by PT    Recommendations for Other Services   NA     Precautions / Restrictions Precautions Precautions: Fall;Knee Precaution Booklet Issued: Yes (comment) Precaution Comments: knee handout given Restrictions Weight Bearing Restrictions: No RLE Weight Bearing: Weight bearing as tolerated    Mobility  Bed Mobility Overal bed mobility: Needs Assistance Bed Mobility: Supine to Sit     Supine to sit: Supervision;HOB elevated     General bed mobility comments: supervision for safety  Transfers Overall transfer level: Needs assistance Equipment used: Rolling walker (2 wheeled) Transfers: Sit to/from Stand Sit to Stand: Min guard         General transfer comment: Min guard assist to support trunk during transitions and ensure a slow descent to sit.  Verbal cues for safe hand placement.   Ambulation/Gait Ambulation/Gait assistance: Min guard Ambulation Distance (Feet): 150  Feet Assistive device: Rolling walker (2 wheeled) Gait Pattern/deviations: Step-through pattern;Antalgic Gait velocity: decreased Gait velocity interpretation: Below normal speed for age/gender General Gait Details: mildly antalgic gait pattern, verbal cues for upright posture.    Stairs Stairs: Yes Stairs assistance: Min assist Stair Management: One rail Left;Step to pattern;Forwards Number of Stairs: 9 General stair comments: Min assist to support trunk when WB through right leg on stairs.  Verbal cues for correct/safest LE sequencing.           Balance Overall balance assessment: Needs assistance Sitting-balance support: No upper extremity supported;Feet supported Sitting balance-Leahy Scale: Good     Standing balance support: Bilateral upper extremity supported Standing balance-Leahy Scale: Fair                      Cognition Arousal/Alertness: Awake/alert Behavior During Therapy: WFL for tasks assessed/performed Overall Cognitive Status: Impaired/Different from baseline Area of Impairment: Attention;Problem solving   Current Attention Level: Sustained Memory: Decreased short-term memory Following Commands: Follows multi-step commands with increased time     Problem Solving: Slow processing General Comments: Pt continues to improve daily with her cognition, however, there are still deficits.  She is slow to process and express information at times and she also needs to be redirected frequently to tasks.     Exercises Total Joint Exercises Ankle Circles/Pumps: AROM;Both;20 reps Quad Sets: AROM;Right;10 reps Towel Squeeze: AROM;Both;10 reps Short Arc Quad: AROM;Right;10 reps Heel Slides: AAROM;Right;10 reps Hip ABduction/ADduction: AROM;Right;10 reps Straight Leg Raises: AROM;Right;10 reps Long Arc Quad: AROM;Right;10 reps Knee Flexion: AROM;Right;10 reps  Pertinent Vitals/Pain Pain Assessment: Faces Pain Score: 0-No pain Faces Pain Scale:  Hurts little more Pain Location: with knee flexion ROM.  Pain Descriptors / Indicators: Burning;Aching Pain Intervention(s): Limited activity within patient's tolerance;Monitored during session;Repositioned           PT Goals (current goals can now be found in the care plan section) Acute Rehab PT Goals Patient Stated Goal: to get well enough to go home after rehab Progress towards PT goals: Progressing toward goals    Frequency  Min 5X/week    PT Plan Current plan remains appropriate       End of Session Equipment Utilized During Treatment: Gait belt Activity Tolerance: Patient limited by fatigue Patient left: in chair;with call bell/phone within reach;with family/visitor present (husband came in at the end of the session. )     Time: [id number] PT Time Calculation (min) (ACUTE ONLY): 33 min  Charges:  $Gait Training: 8-22 mins $Therapeutic Exercise: 8-22 mins                     Hassen Bruun B. Roshan Roback, PT, DPT [phone]   04/11/2015, 2:45 PM

## 2015-04-11 NOTE — Progress Notes (Signed)
CSW provided bed offers to pt and spouse- choice pending.  CSW will continue to follow.  Merlyn LotJenna Holoman, LCSWA Clinical Social Worker [phone]

## 2015-04-11 NOTE — Progress Notes (Signed)
[facility] Anticipated discharge date:04/11/15 Family notified: spouse at bedside [facility] 5:05[facility]  CSW signing off.  Merlyn LotJenna Holoman, LCSWA Clinical Social Worker [phone]

## 2015-04-11 NOTE — Clinical Social Work Note (Signed)
Clinical Social Work Assessment  Patient Details  Name: Tammy Smith MRN: [id number] Date of Birth: 04/10/1946  Date of referral:  04/10/15               Reason for consult:  Facility Placement                Permission sought to share information with:    Permission granted to share information::  Yes, Verbal Permission Granted  Name::     Tammy Smith  [facility]  Relationship::  son  Contact Information:     Housing/Transportation Living arrangements for the past 2 months:  Single Family Home Source of Information:  Patient, Adult Children Patient Interpreter Needed:  None Criminal Activity/Legal Involvement Pertinent to Current Situation/Hospitalization:  No - Comment as needed Significant Relationships:  Adult Children, Spouse Lives with:  Spouse Do you feel safe going back to the place where you live?  Yes Need for family participation in patient care:  No (Coment)  Care giving concerns:  Pt lives with husband but not safe given current level of impairment   Office managerocial Worker assessment / plan:  CSW spoke with pt and pt son concerning SNF placement.  Employment status:  Retired Database administratornsurance information:  Managed Medicare PT Recommendations:  Skilled Nursing Facility Information / Referral to community resources:  Skilled Nursing Facility  Patient/Family's Response to care:  Pt and son agreeable to placement.  [place] has been traveling with husband.    Patient/Family's Understanding of and Emotional Response to Diagnosis, Current Treatment, and Prognosis:  Pt expresses good understanding and is hopeful for quick recovery so she can begin traveling again  Emotional Assessment Appearance:  Appears stated age Attitude/Demeanor/Rapport:    Affect (typically observed):  Anxious, Appropriate Orientation:  Oriented to Self, Oriented to Place, Oriented to  Time, Oriented to Situation Alcohol / Substance use:  Not Applicable Psych involvement (Current  and /or in the community):  No (Comment)  Discharge Needs  Concerns to be addressed:  Care Coordination Readmission within the last 30 days:  No Current discharge risk:  Physical Impairment Barriers to Discharge:  Continued Medical Work up   Tammy Smith, [name], LCSW 04/11/2015, 7:48 AM

## 2015-04-11 NOTE — Discharge Instructions (Signed)
Aspiration Precautions °Aspiration is the breathing in (inhalation) of a liquid or object into the lungs. Things that can be inhaled into the lungs include:  °· Food. °· Any type of liquid, such as drinks or saliva. °· Stomach contents, such as vomit or stomach acid. °When these things go into the lungs, damage can occur and serious complications can result, such as: °· Lung infection (pneumonia). °· Collection of infected liquid (pus) in the lungs (lung abscess). °· Death. °CAUSES °The cause of aspiration may include:  °· A lowered level of awareness (consciousness) due to: °¨ Traumatic brain injury or head injury. °¨ Stroke. °¨ Diseases of the nerves, brain, or spinal cord. °¨ Seizures. °· A problem with the gag reflex. The gag reflex protects the body from swallowing things too quickly or things that are too large. °· Medical conditions that affect swallowing. °· Conditions that affect the food pipe (esophagus). °· Acid reflux. This is when stomach acid moves into the esophagus. °· Any type of surgery where a medicine to sleep (general anesthetic) or relax (sedative) is given. °· Alcohol abuse. °· Illegal drug abuse. °· Taking medicine that causes sleepiness, confusion, or weakness. °· Aging. °· Dental problems. °· Having a feeding tube. °SIGNS AND SYMPTOMS °Symptoms of aspiration may include:  °· Coughing after swallowing food or liquids. °· Difficulty breathing. This may include: °¨ Breathing quickly. °¨ Breathing very slowly. °¨ Loud breathing. °¨ Rumbling sounds from the lungs while breathing. °· Coughing up phlegm (sputum) that: °¨ Is yellow, tan, or green. °¨ Has pieces of food in it. °¨ Is bad smelling. °· A change in voice so that it sounds scratchy. °· A change in skin color. The skin may look red or blue.    °· Fever. °· Watery eyes. °· Pain in the chest or back. °· A pained look on the face.    °· A feeling of fullness in the throat or that something is stuck in the throat. °DIAGNOSIS °Aspiration may  be diagnosed by:  °· Chest X-ray. °· Bronchoscopy. This is a surgical procedure in which a thin, flexible tube with a camera is inserted into the nose or mouth to the lungs. The health care provider can then view the lungs. °· A swallowing evaluation study to find out: °¨ A person's risk of aspiration. °¨ How difficult it is for a person to swallow. °¨ What types of foods are safe for a person to eat. °PREVENTION °If you are caring for someone who can eat and drink through his or her mouth:  °· Have the person sit in an upright position when eating food or drinking fluids, such as: °¨ Sitting up in a chair. °¨ If sitting in a chair is not possible, position the person in bed so he or she is upright. °· Remind the person to eat slowly and chew well. °· Do not distract the person. This is especially important for people with thinking or memory (cognitive) problems. °· Check the person's mouth for leftover food after eating. °· Keep the person sitting upright for 30-45 minutes after eating. °· Do not serve food or drink for at least 2 hours before bedtime. °If you are caring for someone with a feeding tube who cannot eat or drink through his or her mouth: °· Keep the person in an upright position as much as possible. °· Do not  lay the person flat if he or she is getting continuous feedings. Turn the feeding pump off if you need to lay the person flat   for any reason. °· Check feeding tube residuals as directed by your health care provider. Ask your health care provider what residual amount is too high. °General guidelines to prevent aspiration in someone you are caring for include: °· Feed small amounts of food. Do not force feed. °· Food should be thickened as directed by the person's speech pathologist. °· Use as little water as possible when brushing the person's teeth or cleaning his or her mouth. °· Provide oral care before and after meals. °· Never put food or liquids in the mouth of a person who is not fully  alert. °· Crush pills and put them in soft food such as pudding or ice cream. Some pills should not be crushed. Check with your health care provider before crushing any medicine. °SEEK MEDICAL CARE IF: °· The person has a feeding tube and the feeding tube residual amount is too high. °· The person has a fever. °· The person tries to avoid food, such as refusing to eat or be fed, or is eating less than normal. °SEEK IMMEDIATE MEDICAL CARE IF:  °· The person has trouble breathing or starts to breathe quickly. °· The person is breathing very slowly or stops breathing. °· The person coughs a lot after eating or drinking. °· The person has a long-lasting (chronic) cough. °· The person coughs up thick, yellow, or tan sputum. °MAKE SURE YOU:  °· Understand these instructions. °· Will watch the person's condition. °· Will get help right away if the person is not doing well or gets worse. °  °This information is not intended to replace advice given to you by your health care provider. Make sure you discuss any questions you have with your health care provider. °  °Document Released: 05/23/2010 Document Revised: 05/11/2014 Document Reviewed: 07/26/2013 °Elsevier Interactive Patient Education ©2016 [employer] ° °

## 2015-04-11 NOTE — Progress Notes (Signed)
Physical Therapy Treatment Patient Details Name: Tammy Smith MRN: [id number] DOB: 07/04/1945 Today's Date: 04/11/2015    History of Present Illness Patient is a 69 year old female with past medical history significant for coronary artery disease, solitary kidney, diabetes mellitus type 2, hypertension, and osteoarthritis of the knee; who presents acutely for altered mental status. History is provided by the patient's husband she is still altered and unable to give all history herself. They note that she had a right knee arthroscopy performed 04/08/15 by Dr. Annell GreeningMark Yates.    PT Comments    Pt is progressing daily with gait and cognition, but still not back to a safe baseline.  She has significant obstacles to get home to her RV as well (high >12" steps to get in, steps to get up to her bedroom, and limited space for RW use.  She reports she has used a cane in the past during therapy and it, "did not work" as she was unable to be safely gait trained with it.  She really need to be to a point where she can be stable walking without an assistive device to go home with her husband to the RV.  So, I would continue to endorse SNF level rehab at discharge.    Follow Up Recommendations  SNF     Equipment Recommendations  None recommended by PT    Recommendations for Other Services   NA     Precautions / Restrictions Precautions Precautions: Fall;Knee Precaution Booklet Issued: Yes (comment) Precaution Comments: knee handout given, precautions reviewed Restrictions Weight Bearing Restrictions: No RLE Weight Bearing: Weight bearing as tolerated    Mobility     Transfers Overall transfer level: Needs assistance Equipment used: Rolling walker (2 wheeled) Transfers: Sit to/from Stand Sit to Stand: Min assist         General transfer comment: Min assist to support trunk during transitions.  Verbal cues for safe hand placement and slow speed  Ambulation/Gait Ambulation/Gait  assistance: Min assist Ambulation Distance (Feet): 150 Feet Assistive device: Rolling walker (2 wheeled) Gait Pattern/deviations: Step-through pattern;Antalgic Gait velocity:     General Gait Details: mildly antalgic gait pattern.  Pt fatigued quickly.  Min assit for balance and stability during gait.          Balance Overall balance assessment: Needs assistance Sitting-balance support: Feet supported;No upper extremity supported Sitting balance-Leahy Scale: Good     Standing balance support: Bilateral upper extremity supported;No upper extremity supported;Single extremity supported Standing balance-Leahy Scale: Fair                      Cognition Arousal/Alertness: Awake/alert Behavior During Therapy: WFL for tasks assessed/performed Overall Cognitive Status: Impaired/Different from baseline Area of Impairment: Attention;Problem solving   Current Attention Level: Sustained   Following Commands: Follows multi-step commands with increased time     Problem Solving: Slow processing General Comments: Pt continues to improve daily with her cognition, however, there are still deficits.  She is slow to process and express information at times and she also needs to be redirected frequently to tasks.     Exercises Total Joint Exercises Ankle Circles/Pumps: AROM;Both;20 reps Quad Sets: AROM;Right;10 reps Towel Squeeze: AROM;Both;10 reps Short Arc Quad: AROM;Right;10 reps Heel Slides: AAROM;Right;10 reps Hip ABduction/ADduction: AROM;Right;10 reps Straight Leg Raises: AROM;Right;10 reps Long Arc Quad: AROM;Right;10 reps Knee Flexion: AROM;Right;10 reps        Pertinent Vitals/Pain Pain Assessment: 0-10 Pain Score: 0-No pain Pain Location: at rest right knee, some grimacing  during knee flexion ROM.  Pain Intervention(s): Limited activity within patient's tolerance;Monitored during session;Repositioned           PT Goals (current goals can now be found in the  care plan section) Acute Rehab PT Goals Patient Stated Goal: return to walking Progress towards PT goals: Progressing toward goals    Frequency  Min 5X/week    PT Plan Current plan remains appropriate       End of Session Equipment Utilized During Treatment: Gait belt Activity Tolerance: Patient limited by fatigue;Patient limited by pain Patient left: in chair;with call bell/phone within reach;with family/visitor present     Time: 0102-7253 PT Time Calculation (min) (ACUTE ONLY): 48 min  Charges:  $Gait Training: 8-22 mins $Therapeutic Exercise: 23-37 mins                     Kaysea Raya B. Floy Riegler, PT, DPT [phone]   04/11/2015, 12:30 PM

## 2015-04-11 NOTE — Progress Notes (Signed)
[facility] [place] with no answer.

## 2015-04-11 NOTE — Discharge Summary (Signed)
Physician Discharge Summary  Tammy Smith UVO:[id number] DOB: [birth year][name][birth year] DOA: 04/09/2015  PCP: Carlene CoriaBRASWELL,SHERRILL, MD  Admit date: 04/09/2015 Discharge date: 04/11/2015  Recommendations for Outpatient Follow-up:  1. Pt will need to follow up with PCP in 2-3 weeks post discharge 2. Please obtain BMP to evaluate electrolytes and kidney function 3. Close monitoring of renal function on Metformin and Losartan   Discharge Diagnoses:  Principal Problem:   Acute encephalopathy Active Problems:   AKI (acute kidney injury) (HCC)   Essential hypertension   Diabetes mellitus (HCC)   Fever, unspecified   Microcytic hypochromic anemia  Discharge Condition: Stable  Diet recommendation: Heart healthy diet discussed in details   History of present illness:  69 year old female with past medical history significant for coronary artery disease, solitary kidney, diabetes mellitus type 2, hypertension, and osteoarthritis of the knee; who presented acutely for altered mental status. Pt had a right knee arthroscopy performed one day PTA by Dr. Ophelia CharterYates. Pt has taken several narcotics at home and her family noted she was more lethargic and bed bound.   In the emergency department patient was evaluated and given Narcan as well as, empiric antibiotics of Vancomycin and Zosyn for possible infection.  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  Procedures/Studies: X-ray Chest Pa And Lateral 04/10/2015   Mild volume loss at the lung bases. Question mild venous hypertension.   Dg Chest 2 View 04/09/2015   Mild vascular congestion noted.  Lungs remain grossly clear.   Discharge Exam: Filed Vitals:   04/10/15 2330 04/11/15 0405  BP:  146/71  Pulse:  90  Temp:  98.4 F (36.9 C)  Resp: 18 16   Filed Vitals:   04/10/15 1932 04/10/15 2031 04/10/15 2330 04/11/15 0405  BP: 134/61   146/71  Pulse: 96   90  Temp: 98.2 F (36.8 C)   98.4 F (36.9 C)  TempSrc:    Oral  Resp: 18  18 16   Height:      Weight:      SpO2: 94% 94%  96%    General: Pt is alert, follows commands appropriately, not in acute distress Cardiovascular: Regular rate and rhythm, no rubs, no gallops Respiratory: Clear to auscultation bilaterally, no wheezing, no crackles, no rhonchi Abdominal: Soft, non tender, non distended, bowel sounds +, no guarding  Discharge Instructions     Medication List    STOP taking these medications        oxycodone 5 MG capsule  Commonly known as:  OXY-IR      TAKE these medications        albuterol 108 (90 BASE) MCG/ACT inhaler  Commonly known as:  PROVENTIL HFA;VENTOLIN HFA  Inhale 2 puffs into the lungs every 6 (six) hours as needed for wheezing or shortness of  breath.     alfuzosin 10 MG 24 hr tablet  Commonly known as:  UROXATRAL  Take 10 mg by mouth every evening.     amLODipine 5 MG tablet  Commonly known as:  NORVASC  Take 5 mg by mouth daily.     clorazepate 15 MG tablet  Commonly known as:  TRANXENE  Take 15 mg by mouth 2 (two) times daily.     colestipol 1 G tablet  Commonly known as:  COLESTID  Take 1 g by mouth daily.     docusate sodium 100 MG capsule  Commonly known as:  COLACE  Take 1 capsule (100 mg total) by mouth daily.     esomeprazole 40 MG capsule  Commonly known as:  NEXIUM  Take 40 mg by mouth daily at  12 noon.     gabapentin 300 MG capsule  Commonly known as:  NEURONTIN  Take 1,200 mg by mouth 2 (two) times daily.     glimepiride 4 MG tablet  Commonly known as:  AMARYL  Take 4 mg by mouth 2 (two) times daily.     hydrALAZINE 25 MG tablet  Commonly known as:  APRESOLINE  Take 25 mg by mouth 2 (two) times daily.     ibuprofen 200 MG tablet  Commonly known as:  ADVIL,MOTRIN  Take 200 mg by mouth every 6 (six) hours as needed.     insulin glargine 100 UNIT/ML injection  Commonly known as:  LANTUS  Inject 25 Units into the skin at bedtime.     losartan 25 MG tablet  Commonly known as:  COZAAR  Take 25 mg by mouth daily.     metFORMIN 500 MG tablet  Commonly known as:  GLUCOPHAGE  Take 500 mg by mouth 2 (two) times daily with a meal.     mirabegron ER 50 MG Tb24 tablet  Commonly known as:  MYRBETRIQ  Take 50 mg by mouth every evening.     oxyCODONE-acetaminophen 5-325 MG tablet  Commonly known as:  PERCOCET/ROXICET  Take 1 tablet by mouth every 6 (six) hours.     propranolol 80 MG tablet  Commonly known as:  INDERAL  Take 80 mg by mouth 2 (two) times daily.     sitaGLIPtin 100 MG tablet  Commonly known as:  JANUVIA  Take 50 mg by mouth daily.     ursodiol 250 MG tablet  Commonly known as:  ACTIGALL  Take 250 mg by mouth 2 (two) times daily.           Follow-up Information    Follow up with Carlene Coria, MD.   Specialty:  Family Medicine       The results of significant diagnostics from this hospitalization (including imaging, microbiology, ancillary and laboratory) are listed below for reference.     Microbiology: Recent Results (from the past 240 hour(s))  Blood Culture (routine x 2)     Status: None (Preliminary result)   Collection Time: 04/09/15  7:45 PM  Result Value Ref Range Status   Specimen Description BLOOD RIGHT ARM  Final   Special Requests BOTTLES DRAWN AEROBIC AND ANAEROBIC 5CC  Final   Culture NO GROWTH < 24 HOURS  Final    Report Status PENDING  Incomplete  Blood Culture (routine x 2)     Status: None (Preliminary result)   Collection Time: 04/09/15  7:54 PM  Result Value Ref Range Status   Specimen Description BLOOD LEFT FOREARM  Final   Special Requests BOTTLES  DRAWN AEROBIC AND ANAEROBIC 5CC  Final   Culture NO GROWTH < 24 HOURS  Final   Report Status PENDING  Incomplete  Urine culture     Status: None (Preliminary result)   Collection Time: 04/09/15  9:46 PM  Result Value Ref Range Status   Specimen Description URINE, CATHETERIZED  Final   Special Requests NONE  Final   Culture NO GROWTH < 12 HOURS  Final   Report Status PENDING  Incomplete     Labs: Basic Metabolic Panel:  Recent Labs Lab 04/09/15 1945 04/10/15 0920 04/11/15 0307  NA 137 140 141  K 4.9 4.3 3.7  CL 102 108 105  CO2 GLUCOSE 69 159* 148*  BUN CREATININE 1.53* 1.21* 1.11*  CALCIUM 8.6* 8.0* 8.7*   Liver Function Tests:  Recent Labs Lab 04/09/15 1945  AST 49*  ALT 45  ALKPHOS 101  BILITOT 0.5  PROT 6.4*  ALBUMIN 3.2*   CBC:  Recent Labs Lab 04/09/15 1945 04/10/15 0920 04/11/15 0307  WBC 13.1* 9.2 7.5  NEUTROABS 7.7 5.9  --   HGB 10.9* 9.9* 9.6*  HCT 37.2 33.4* 32.1*  MCV 75.3* 74.7* 73.6*  PLT 242 219 208   CBG:  Recent Labs Lab 04/10/15 1107 04/10/15 1611 04/10/15 2039 04/11/15 0009 04/11/15 0404  GLUCAP 134* 216* 123* 126* 141*   SIGNED: Time coordinating discharge:  30 minutes  MAGICK-Tammy Bednarski, MD  [facility] 04/11/2015, 7:44 AM Pager [phone]  If 7PM-7AM, please contact night-coverage [email or web address] Password [email or web address]

## 2015-04-11 NOTE — Progress Notes (Signed)
Patient ID: Tammy BreathDorothy Smith, female   DOB: 09/26/1945, 69 y.o.   MRN: [id number] Mental status back to baseline. She stays in a large RV. She is worried about steps and falling. Continue PT.  SNF other option.

## 2015-04-12 ENCOUNTER — Encounter: Payer: Self-pay | Admitting: Adult Health

## 2015-04-12 ENCOUNTER — Non-Acute Institutional Stay (SKILLED_NURSING_FACILITY): Payer: Medicare Other | Admitting: Adult Health

## 2015-04-12 DIAGNOSIS — E785 Hyperlipidemia, unspecified: Secondary | ICD-10-CM

## 2015-04-12 DIAGNOSIS — I10 Essential (primary) hypertension: Secondary | ICD-10-CM

## 2015-04-12 DIAGNOSIS — K5900 Constipation, unspecified: Secondary | ICD-10-CM | POA: Diagnosis not present

## 2015-04-12 DIAGNOSIS — N179 Acute kidney failure, unspecified: Secondary | ICD-10-CM | POA: Diagnosis not present

## 2015-04-12 DIAGNOSIS — G629 Polyneuropathy, unspecified: Secondary | ICD-10-CM | POA: Diagnosis not present

## 2015-04-12 DIAGNOSIS — D509 Iron deficiency anemia, unspecified: Secondary | ICD-10-CM | POA: Diagnosis not present

## 2015-04-12 DIAGNOSIS — M1711 Unilateral primary osteoarthritis, right knee: Secondary | ICD-10-CM

## 2015-04-12 DIAGNOSIS — N3281 Overactive bladder: Secondary | ICD-10-CM

## 2015-04-12 DIAGNOSIS — G4733 Obstructive sleep apnea (adult) (pediatric): Secondary | ICD-10-CM

## 2015-04-12 DIAGNOSIS — E46 Unspecified protein-calorie malnutrition: Secondary | ICD-10-CM

## 2015-04-12 DIAGNOSIS — K219 Gastro-esophageal reflux disease without esophagitis: Secondary | ICD-10-CM

## 2015-04-12 DIAGNOSIS — F419 Anxiety disorder, unspecified: Secondary | ICD-10-CM | POA: Diagnosis not present

## 2015-04-12 DIAGNOSIS — E1140 Type 2 diabetes mellitus with diabetic neuropathy, unspecified: Secondary | ICD-10-CM | POA: Diagnosis not present

## 2015-04-12 DIAGNOSIS — R531 Weakness: Secondary | ICD-10-CM

## 2015-04-12 DIAGNOSIS — Z794 Long term (current) use of insulin: Secondary | ICD-10-CM

## 2015-04-14 LAB — CULTURE, BLOOD (ROUTINE X 2)
Culture: NO GROWTH
Culture: NO GROWTH

## 2015-04-15 ENCOUNTER — Non-Acute Institutional Stay (SKILLED_NURSING_FACILITY): Payer: Medicare Other | Admitting: Internal Medicine

## 2015-04-15 DIAGNOSIS — G629 Polyneuropathy, unspecified: Secondary | ICD-10-CM | POA: Diagnosis not present

## 2015-04-15 DIAGNOSIS — N289 Disorder of kidney and ureter, unspecified: Secondary | ICD-10-CM | POA: Diagnosis not present

## 2015-04-15 DIAGNOSIS — F411 Generalized anxiety disorder: Secondary | ICD-10-CM

## 2015-04-15 DIAGNOSIS — K219 Gastro-esophageal reflux disease without esophagitis: Secondary | ICD-10-CM | POA: Diagnosis not present

## 2015-04-15 DIAGNOSIS — Z794 Long term (current) use of insulin: Secondary | ICD-10-CM

## 2015-04-15 DIAGNOSIS — D638 Anemia in other chronic diseases classified elsewhere: Secondary | ICD-10-CM

## 2015-04-15 DIAGNOSIS — I10 Essential (primary) hypertension: Secondary | ICD-10-CM | POA: Diagnosis not present

## 2015-04-15 DIAGNOSIS — R531 Weakness: Secondary | ICD-10-CM

## 2015-04-15 DIAGNOSIS — M1711 Unilateral primary osteoarthritis, right knee: Secondary | ICD-10-CM

## 2015-04-15 DIAGNOSIS — K5900 Constipation, unspecified: Secondary | ICD-10-CM | POA: Diagnosis not present

## 2015-04-15 DIAGNOSIS — E1140 Type 2 diabetes mellitus with diabetic neuropathy, unspecified: Secondary | ICD-10-CM

## 2015-04-15 DIAGNOSIS — R600 Localized edema: Secondary | ICD-10-CM | POA: Diagnosis not present

## 2015-04-15 DIAGNOSIS — N3281 Overactive bladder: Secondary | ICD-10-CM

## 2015-04-15 DIAGNOSIS — E785 Hyperlipidemia, unspecified: Secondary | ICD-10-CM

## 2015-04-15 NOTE — Progress Notes (Signed)
Patient ID: Tammy Smith, female Tammy Smith  DOB: 11/14/1945, 69 y.o.   MRN: [id number]     [facility]   PCP: Tammy Smith,SHERRILL, MD  Code Status: full code  Allergies  Allergen Reactions  . Byetta 10 Mcg Pen [Exenatide] Nausea And Vomiting  . Codeine Nausea And Vomiting  . Sulfa Antibiotics Other (See Comments)    Weird feeling  . Tramadol Other (See Comments)    headache  . Ace Inhibitors Cough  . Adhesive [Tape] Itching  . Iodinated Diagnostic Agents Rash  . Latex Itching    Chief Complaint  Patient presents with  . New Admit To SNF     HPI:  69 y.o. patient is here for short term rehabilitation post hospital admission from 04/09/15-04/11/15 with acute encephalopathy. She had undergone right knee arthroscopy a day before and had taken several narcotics on day of arrival to the ED. She required narcan and was also empirically treated with vancomycin and zosyn. Her narcotics were held. She had acute kidney injury and required iv fluids. She has PMH of CAD, DM, HTN, OA. She is seen in her room today. She would like her prilosec in am. She has been having regular bowel movement. She denies any concern.  Review of Systems:  Constitutional: Negative for fever, chills, diaphoresis.  HENT: Negative for headache, congestion, nasal discharge, difficulty swallowing.   Eyes: Negative for eye pain, blurred vision, double vision and discharge.  Respiratory: Negative for cough, shortness of Smith and wheezing.   Cardiovascular: Negative for chest pain, palpitations. Positive for leg swelling.  Gastrointestinal: Negative for heartburn, nausea, vomiting, abdominal pain. Bowel movement this am Genitourinary: Negative for dysuria, flank pain.  Musculoskeletal: Negative for back pain, falls Skin: Negative for itching, rash.  Neurological: Negative for dizziness, tingling, focal weakness Psychiatric/Behavioral: Negative for depression   Past Medical History    Diagnosis Date  . Coronary artery disease    Past Surgical History  Procedure Laterality Date  . Knee arthroscopy Right   . Abdominal hysterectomy     Social History:   reports that she has never smoked. She does not have any smokeless tobacco history on file. She reports that she does not drink alcohol. Her drug history is not on file.  No family history on file.  Medications:   Medication List       This list is accurate as of: 04/15/15  2:02 PM.  Always use your most recent med list.               albuterol 108 (90 BASE) MCG/ACT inhaler  Commonly known as:  PROVENTIL HFA;VENTOLIN HFA  Inhale 2 puffs into the lungs every 6 (six) hours as needed for wheezing or shortness of Smith.     alfuzosin 10 MG 24 hr tablet  Commonly known as:  UROXATRAL  Take 10 mg by mouth every evening.     amLODipine 5 MG tablet  Commonly known as:  NORVASC  Take 5 mg by mouth daily.     clorazepate 15 MG tablet  Commonly known as:  TRANXENE  Take 15 mg by mouth 2 (two) times daily.     colestipol 1 G tablet  Commonly known as:  COLESTID  Take 1 g by mouth daily.     docusate sodium 100 MG capsule  Commonly known as:  COLACE  Take 1 capsule (100 mg total) by mouth daily.     esomeprazole 40 MG capsule  Commonly known as:  NEXIUM  Take 40 mg by mouth daily at 12 noon.     gabapentin 300 MG capsule  Commonly known as:  NEURONTIN  Take 1,200 mg by mouth 2 (two) times daily.     glimepiride 4 MG tablet  Commonly known as:  AMARYL  Take 4 mg by mouth 2 (two) times daily.     hydrALAZINE 25 MG tablet  Commonly known as:  APRESOLINE  Take 25 mg by mouth 2 (two) times daily.     ibuprofen 200 MG tablet  Commonly known as:  ADVIL,MOTRIN  Take 200 mg by mouth every 6 (six) hours as needed.     insulin glargine 100 UNIT/ML injection  Commonly known as:  LANTUS  Inject 25 Units into the skin at bedtime.     losartan 25 MG tablet  Commonly known as:  COZAAR  Take 25 mg by  mouth daily.     metFORMIN 500 MG tablet  Commonly known as:  GLUCOPHAGE  Take 500 mg by mouth 2 (two) times daily with a meal.     mirabegron ER 50 MG Tb24 tablet  Commonly known as:  MYRBETRIQ  Take 50 mg by mouth every evening.     oxyCODONE-acetaminophen 5-325 MG tablet  Commonly known as:  PERCOCET/ROXICET  Take 1 tablet by mouth every 6 (six) hours.     propranolol 80 MG tablet  Commonly known as:  INDERAL  Take 80 mg by mouth 2 (two) times daily.     sitaGLIPtin 100 MG tablet  Commonly known as:  JANUVIA  Take 50 mg by mouth daily.     ursodiol 250 MG tablet  Commonly known as:  ACTIGALL  Take 250 mg by mouth 2 (two) times daily.         Physical Exam: Filed Vitals:   04/15/15 1401  BP: 144/67  Pulse: 77  Temp: 97 F (36.1 C)  Resp: 16  Weight: 213 lb (96.616 kg)  SpO2: 92%    General- elderly female, obese, in no acute distress Head- normocephalic, atraumatic, left ear auricle has a skin area with scab, no signs of infection Nose- normal nasal mucosa, no maxillary or frontal sinus tenderness, no nasal discharge Throat- moist mucus membrane Eyes- PERRLA, EOMI, no pallor, no icterus, no discharge, normal conjunctiva, normal sclera Neck- no cervical lymphadenopathy Cardiovascular- normal s1,s2, no murmurs, palpable dorsalis pedis and radial pulses, 1+ leg edema Respiratory- bilateral clear to auscultation, no wheeze, no rhonchi, no crackles, no use of accessory muscles Abdomen- bowel sounds present, soft, non tender Musculoskeletal- able to move all 4 extremities, limited right knee ROM Neurological- no focal deficit, alert and oriented to person, place and time Skin- warm and dry, right knee dressing in place and clean Psychiatry- normal mood and affect    Labs reviewed: Basic Metabolic Panel:  Recent Labs  16/10/96 1945 04/10/15 0920 04/11/15 0307  NA 137 140 141  K 4.9 4.3 3.7  CL 102 108 105  CO2 GLUCOSE 69 159* 148*  BUN CREATININE 1.53* 1.21* 1.11*  CALCIUM 8.6* 8.0* 8.7*   Liver Function Tests:  Recent Labs  04/09/15 1945  AST 49*  ALT 45  ALKPHOS 101  BILITOT 0.5  PROT 6.4*  ALBUMIN 3.2*   No results for input(s): LIPASE, AMYLASE in the last 8760 hours. No results for input(s): AMMONIA in the last 8760 hours. CBC:  Recent Labs  04/09/15 1945 04/10/15 0920 04/11/15 0307  WBC 13.1* 9.2  7.5  NEUTROABS 7.7 5.9  --   HGB 10.9* 9.9* 9.6*  HCT 37.2 33.4* 32.1*  MCV 75.3* 74.7* 73.6*  PLT 242 219 208   Cardiac Enzymes: No results for input(s): CKTOTAL, CKMB, CKMBINDEX, TROPONINI in the last 8760 hours. BNP: Invalid input(s): POCBNP CBG:  Recent Labs  04/11/15 0747 04/11/15 1118 04/11/15 1612  GLUCAP 156* 207* 177*    Radiological Exams: X-ray Chest Pa And Lateral  04/10/2015  CLINICAL DATA:  Shortness of Smith and difficulty breathing following knee surgery. EXAM: CHEST  2 VIEW COMPARISON:  04/09/2015 FINDINGS: Heart remains enlarged. Mediastinal shadows are unremarkable. There is mild atelectasis at the lung bases. No lobar collapse. Question venous hypertension. No frank edema. No effusions. IMPRESSION: Mild volume loss at the lung bases. Question mild venous hypertension. Electronically Signed   By: Paulina Fusi M.D.   On: 04/10/2015 12:33   Dg Chest 2 View  04/09/2015  CLINICAL DATA:  Acute onset of cough. Weakness and tiredness. Recent knee surgery. Initial encounter. EXAM: CHEST  2 VIEW COMPARISON:  None. FINDINGS: The lungs are well-aerated. Mild vascular congestion is noted. There is no evidence of focal opacification, pleural effusion or pneumothorax. The heart is borderline normal in size. No acute osseous abnormalities are seen. IMPRESSION: Mild vascular congestion noted.  Lungs remain grossly clear. Electronically Signed   By: Roanna Raider M.D.   On: 04/09/2015 20:18    Assessment/Plan  Generalized weakness Will have patient work with PT/OT as tolerated to regain  strength and restore function.  Fall precautions are in place.  Knee OA S/p knee arthroscopy. Continue advil 400 mg q6h prn pain with percocet 5-325 mg q6h prn pain. Monitor renal function and cbc  Leg edema Likely from venous pooling. Add ted hose for now and monitor.  Constipation Stable, change colace to 100 mg daily prn only per patient request  gerd Omeprazole is not helping. D/c omeprazole and start her home regimen nexium but change nexium to 40 mg daily in am  Anemia Likely from chronic disease, monitor cbc  Impaired kidney function Currently on advil and metformin which can worsen this. Patient understands this. Monitor bmp  HTN Stable bp, continue amlodipine 5 mg daily, propranolol 80 mg bid, hydralazine 25 mg bid, losartan 25 mg daily. Monitor BP  DM type 2 Monitor cbg, continue lantus 25 u daily, metformin 500 mg bid, januvia 50 mg daily, amaryl 4 mg twice daily, check a1c  Neuropathy With her DM. Continue neurontin 300 mg bid  OAB Continue myrbetriq 50 mg daily  GAD Continue clorazepate 15 mg bid  HLD Continue colestipol 1 g daily   Goals of care: short term rehabilitation   Labs/tests ordered: cbc, cmp, a1c  Family/ staff Communication: reviewed care plan with patient and nursing supervisor    Oneal Grout, MD  [facility] [phone] (Monday-Friday 8 am - 5 pm) [phone] (afterhours)

## 2015-04-17 ENCOUNTER — Non-Acute Institutional Stay (SKILLED_NURSING_FACILITY): Payer: Medicare Other | Admitting: Adult Health

## 2015-04-17 ENCOUNTER — Encounter: Payer: Self-pay | Admitting: Adult Health

## 2015-04-17 DIAGNOSIS — N179 Acute kidney failure, unspecified: Secondary | ICD-10-CM | POA: Diagnosis not present

## 2015-04-17 DIAGNOSIS — M1711 Unilateral primary osteoarthritis, right knee: Secondary | ICD-10-CM | POA: Diagnosis not present

## 2015-04-17 DIAGNOSIS — N3281 Overactive bladder: Secondary | ICD-10-CM

## 2015-04-17 DIAGNOSIS — K219 Gastro-esophageal reflux disease without esophagitis: Secondary | ICD-10-CM

## 2015-04-17 DIAGNOSIS — F419 Anxiety disorder, unspecified: Secondary | ICD-10-CM

## 2015-04-17 DIAGNOSIS — E785 Hyperlipidemia, unspecified: Secondary | ICD-10-CM | POA: Diagnosis not present

## 2015-04-17 DIAGNOSIS — E1140 Type 2 diabetes mellitus with diabetic neuropathy, unspecified: Secondary | ICD-10-CM

## 2015-04-17 DIAGNOSIS — K5900 Constipation, unspecified: Secondary | ICD-10-CM | POA: Diagnosis not present

## 2015-04-17 DIAGNOSIS — R531 Weakness: Secondary | ICD-10-CM | POA: Diagnosis not present

## 2015-04-17 DIAGNOSIS — G629 Polyneuropathy, unspecified: Secondary | ICD-10-CM | POA: Diagnosis not present

## 2015-04-17 DIAGNOSIS — Z794 Long term (current) use of insulin: Secondary | ICD-10-CM

## 2015-04-17 DIAGNOSIS — G4733 Obstructive sleep apnea (adult) (pediatric): Secondary | ICD-10-CM

## 2015-04-17 DIAGNOSIS — E46 Unspecified protein-calorie malnutrition: Secondary | ICD-10-CM | POA: Diagnosis not present

## 2015-04-17 DIAGNOSIS — D509 Iron deficiency anemia, unspecified: Secondary | ICD-10-CM

## 2015-04-17 DIAGNOSIS — I10 Essential (primary) hypertension: Secondary | ICD-10-CM

## 2015-04-18 NOTE — Progress Notes (Signed)
Patient ID: Tammy Smith, female   DOB: 1945/07/02, 69 y.o.   MRN: [id number]    DATE:  1214/16  MRN:  [id number]  BIRTHDAY: 12/28/1945  Facility:  Nursing Home Location:  [facility]  Nursing Home Room Number: [phone]  LEVEL OF CARE:  SNF [phone])  Contact Information    Name Relation Home Work Mobile   Clarks Spouse [phone]  [phone]   Chantel, Teti   [phone]       Chief Complaint  Patient presents with  . Discharge Note    Generalized weakness, osteoarthritis of right kne S/P arthroscopy, AKI, hypertension, diabetes mellitus, neuropathy, anemia, anxiety, constipation, GERD, OAB, hyperlipidemia and protein calorie malnutrition    HISTORY OF PRESENT ILLNESS: This is a 69 year old female who is for discharge home with home health PT and OT.  She has been admitted to [facility] on 04/11/15 from [facility]. She has PMH of CAD, solitary kidney, diabetes mellitus type 2, hypertension and osteoarthritis. She was brought to ED for altered mental status. Patient had right knee arthroscopy performed 1 day prior to hospital admission. Patient has taken several narcotics at home and her family noted that she was more lethargic and bedbound. She was given Narcan and antibiotics vancomycin and Zosyn for possible infection were given.  Patient was admitted to this facility for short-term rehabilitation after the patient's recent hospitalization.  Patient has completed SNF rehabilitation and therapy has cleared the patient for discharge.  PAST MEDICAL HISTORY:  Past Medical History  Diagnosis Date  . Coronary artery disease     CURRENT MEDICATIONS: Reviewed  Patient's Medications  New Prescriptions   No medications on file  Previous Medications   ALBUTEROL (PROVENTIL HFA;VENTOLIN HFA) 108 (90 BASE) MCG/ACT INHALER    Inhale 2 puffs into the lungs every 6 (six) hours as needed for wheezing or shortness of breath.   ALFUZOSIN  (UROXATRAL) 10 MG 24 HR TABLET    Take 10 mg by mouth every evening.   AMLODIPINE (NORVASC) 5 MG TABLET    Take 5 mg by mouth daily.   CLORAZEPATE (TRANXENE) 15 MG TABLET    Take 15 mg by mouth 2 (two) times daily.   COLESTIPOL (COLESTID) 1 G TABLET    Take 1 g by mouth daily.   DOCUSATE SODIUM (COLACE) 100 MG CAPSULE    Take 1 capsule (100 mg total) by mouth daily.   ESOMEPRAZOLE (NEXIUM) 40 MG CAPSULE    Take 40 mg by mouth daily at 12 noon.   GABAPENTIN (NEURONTIN) 300 MG CAPSULE    Take 1,200 mg by mouth 2 (two) times daily.   GLIMEPIRIDE (AMARYL) 4 MG TABLET    Take 4 mg by mouth 2 (two) times daily.   HYDRALAZINE (APRESOLINE) 25 MG TABLET    Take 25 mg by mouth 2 (two) times daily.   IBUPROFEN (ADVIL,MOTRIN) 200 MG TABLET    Take 200 mg by mouth every 6 (six) hours as needed.   INSULIN GLARGINE (LANTUS) 100 UNIT/ML INJECTION    Inject 25 Units into the skin at bedtime.   LOSARTAN (COZAAR) 25 MG TABLET    Take 25 mg by mouth daily.   METFORMIN (GLUCOPHAGE) 500 MG TABLET    Take 500 mg by mouth 2 (two) times daily with a meal.   MIRABEGRON ER (MYRBETRIQ) 50 MG TB24 TABLET    Take 50 mg by mouth every evening.   OXYCODONE-ACETAMINOPHEN (PERCOCET/ROXICET) 5-325 MG TABLET    Take 1 tablet  by mouth every 6 (six) hours.   PROPRANOLOL (INDERAL) 80 MG TABLET    Take 80 mg by mouth 2 (two) times daily.   SITAGLIPTIN (JANUVIA) 100 MG TABLET    Take 50 mg by mouth daily.   URSODIOL (ACTIGALL) 250 MG TABLET    Take 250 mg by mouth 2 (two) times daily.  Modified Medications   No medications on file  Discontinued Medications   No medications on file     Allergies  Allergen Reactions  . Byetta 10 Mcg Pen [Exenatide] Nausea And Vomiting  . Codeine Nausea And Vomiting  . Sulfa Antibiotics Other (See Comments)    Weird feeling  . Tramadol Other (See Comments)    headache  . Ace Inhibitors Cough  . Adhesive [Tape] Itching  . Iodinated Diagnostic Agents Rash  . Latex Itching     REVIEW OF  SYSTEMS:  GENERAL: no change in appetite, no fatigue, no weight changes, no fever, chills or weakness EYES: Denies change in vision, dry eyes, eye pain, itching or discharge EARS: Denies change in hearing, ringing in ears, or earache NOSE: Denies nasal congestion or epistaxis MOUTH and THROAT: Denies oral discomfort, gingival pain or bleeding, pain from teeth or hoarseness   RESPIRATORY: no cough, SOB, DOE, wheezing, hemoptysis CARDIAC: no chest pain, or palpitations, +edema GI: no abdominal pain, diarrhea, constipation, heart burn, nausea or vomiting GU: Denies dysuria, frequency, hematuria, incontinence, or discharge PSYCHIATRIC: Denies feeling of depression or anxiety. No report of hallucinations, insomnia, paranoia, or agitation   PHYSICAL EXAMINATION  GENERAL APPEARANCE: Well nourished. In no acute distress. Normal body habitus SKIN:  Right knee with 3 sites (S/P arthroscopy), healed, no redness HEAD: Normal in size and contour. No evidence of trauma EYES: Lids open and close normally. No blepharitis, entropion or ectropion. PERRL. Conjunctivae are clear and sclerae are white. Lenses are without opacity EARS: Pinnae are normal. Patient hears normal voice tunes of the examiner MOUTH and THROAT: Lips are without lesions. Oral mucosa is moist and without lesions. Tongue is normal in shape, size, and color and without lesions NECK: supple, trachea midline, no neck masses, no thyroid tenderness, no thyromegaly LYMPHATICS: no LAN in the neck, no supraclavicular LAN RESPIRATORY: breathing is even & unlabored, BS CTAB CARDIAC: RRR, no murmur,no extra heart sounds, BLE edema 1+ GI: abdomen soft, normal BS, no masses, no tenderness, no hepatomegaly, no splenomegaly EXTREMITIES:  Able to move 4 extremities PSYCHIATRIC: Alert and oriented X 3. Affect and behavior are appropriate  LABS/RADIOLOGY: Labs reviewed: 04/16/15  WBC 9.4 hemoglobin 10.2 hematocrit 34.5 MCV 73.4 platelet 296  hemoglobin A1c 7.5 sodium 142 potassium 4.3 glucose 136 BUN 21 creatinine 1.16 calcium 9.1 total protein 6.3 albumin 3.74 globulin 2.6 alkaline phosphatase 128 SGOT 42 SGPT 32 Basic Metabolic Panel:  Recent Labs  16/02/9611/06/16 1945 04/10/15 0920 04/11/15 0307  NA 137 140 141  K 4.9 4.3 3.7  CL 102 108 105  CO2 25 25 28   GLUCOSE 69 159* 148*  BUN 15 13 10   CREATININE 1.53* 1.21* 1.11*  CALCIUM 8.6* 8.0* 8.7*   Liver Function Tests:  Recent Labs  04/09/15 1945  AST 49*  ALT 45  ALKPHOS 101  BILITOT 0.5  PROT 6.4*  ALBUMIN 3.2*   CBC:  Recent Labs  04/09/15 1945 04/10/15 0920 04/11/15 0307  WBC 13.1* 9.2 7.5  NEUTROABS 7.7 5.9  --   HGB 10.9* 9.9* 9.6*  HCT 37.2 33.4* 32.1*  MCV 75.3* 74.7* 73.6*  PLT 242  219 208   CBG:  Recent Labs  04/11/15 0747 04/11/15 1118 04/11/15 1612  GLUCAP 156* 207* 177*    X-ray Chest Pa And Lateral  04/10/2015  CLINICAL DATA:  Shortness of breath and difficulty breathing following knee surgery. EXAM: CHEST  2 VIEW COMPARISON:  04/09/2015 FINDINGS: Heart remains enlarged. Mediastinal shadows are unremarkable. There is mild atelectasis at the lung bases. No lobar collapse. Question venous hypertension. No frank edema. No effusions. IMPRESSION: Mild volume loss at the lung bases. Question mild venous hypertension. Electronically Signed   By: Paulina Fusi M.D.   On: 04/10/2015 12:33   Dg Chest 2 View  04/09/2015  CLINICAL DATA:  Acute onset of cough. Weakness and tiredness. Recent knee surgery. Initial encounter. EXAM: CHEST  2 VIEW COMPARISON:  None. FINDINGS: The lungs are well-aerated. Mild vascular congestion is noted. There is no evidence of focal opacification, pleural effusion or pneumothorax. The heart is borderline normal in size. No acute osseous abnormalities are seen. IMPRESSION: Mild vascular congestion noted.  Lungs remain grossly clear. Electronically Signed   By: Roanna Raider M.D.   On: 04/09/2015 20:18     ASSESSMENT/PLAN:  Generalized weakness - for home health PT and OT  Osteoarthritis S/P arthroscopy - continue Motrin 200 mg 1 tab by mouth every 6 hours when necessary and Percocet 5/325 mg 1 tab by mouth every 6 hours when necessary for pain  AKI - creatinine 1.11;  Re-check creatinine  1.16  Hypertension - continue Norvasc 5 mg 1 tab by mouth daily, Inderal 80 mg 1 tab by mouth twice a day and hydralazine 25 mg 1 tab by mouth twice a day  Diabetes mellitus, type II - continue Sitagliptin 50 mg 1 tab by mouth daily, glimepiride 4 mg 1 tab by mouth twice a day, Lantus 25 units subcutaneous daily at bedtime and metformin 500 mg 1 tab by mouth twice a day; hgbA1c 7.5  Neuropathy - continue Neurontin 1200 mg by mouth twice a day  Iron deficiency anemia - hemoglobin 9.6; re-check hgb 10.2, improved  Anxiety - mood this is stable; continue Tranxene 15 mg 1 tab by mouth twice a day  Constipation - mood is stable; continue Colace 100 mg 1 capsule by mouth daily  GERD - continue Nexium 40 mg 1 capsule by mouth @ 12 noon  OAB - continue Myrbetriq 50 mg every 4 hour 1 tab by mouth daily  Hyperlipidemia - continue Actigall 250 mg by mouth twice a day  Protein calorie malnutrition - continue Procel 1 scoop by mouth twice a day  OSA - continue CPAP @ HS      I have filled out patient's discharge paperwork and written prescriptions.  Patient will receive home health PT, OT, ST, nursing and CNA.  Total discharge time: Less than 30 minutes  Discharge time involved coordination of the discharge process with Child psychotherapist, nursing staff and therapy department. Medical justification for home health services verified.   [facility], NP [employer] [phone]

## 2015-04-18 NOTE — Progress Notes (Signed)
Patient ID: Tammy Smith, female   DOB: 15-Mar-1946, 69 y.o.   MRN: [id number]    DATE:  04/12/15  MRN:  [id number]  BIRTHDAY: 06-24-1945  Facility:  Nursing Home Location:  [facility]  Nursing Home Room Number: [phone]  LEVEL OF CARE:  SNF [phone])  Contact Information    Name Relation Home Work Mobile   Paint Spouse [phone]  [phone]   Mikela, Senn   [phone]       Chief Complaint  Patient presents with  . Hospitalization Follow-up    Generalized weakness, osteoarthritis of right kne S/P arthroscopy, AKI, hypertension, diabetes mellitus, neuropathy, anemia, anxiety, constipation, GERD, OAB, hyperlipidemia and protein calorie malnutrition    HISTORY OF PRESENT ILLNESS: This is a 69 year old female who has been admitted to [facility] on 04/11/15 from [facility]. She has PMH of CAD, solitary kidney, diabetes mellitus type 2, hypertension and osteoarthritis. She was brought to ED for altered mental status. Patient had right knee arthroscopy performed 1 day prior to hospital admission. Patient has taken several narcotics at home and her family noted that she was more lethargic and bedbound. She was given Narcan and antibiotics vancomycin and Zosyn for possible infection were given.  She has been admitted for a short-term rehabilitation.  PAST MEDICAL HISTORY:  Past Medical History  Diagnosis Date  . Coronary artery disease      CURRENT MEDICATIONS: Reviewed  Patient's Medications  New Prescriptions   No medications on file  Previous Medications   ALBUTEROL (PROVENTIL HFA;VENTOLIN HFA) 108 (90 BASE) MCG/ACT INHALER    Inhale 2 puffs into the lungs every 6 (six) hours as needed for wheezing or shortness of breath.   ALFUZOSIN (UROXATRAL) 10 MG 24 HR TABLET    Take 10 mg by mouth every evening.   AMLODIPINE (NORVASC) 5 MG TABLET    Take 5 mg by mouth daily.   CLORAZEPATE (TRANXENE) 15 MG TABLET    Take 15 mg by mouth 2 (two)  times daily.   COLESTIPOL (COLESTID) 1 G TABLET    Take 1 g by mouth daily.   DOCUSATE SODIUM (COLACE) 100 MG CAPSULE    Take 1 capsule (100 mg total) by mouth daily.   ESOMEPRAZOLE (NEXIUM) 40 MG CAPSULE    Take 40 mg by mouth daily at 12 noon.   GABAPENTIN (NEURONTIN) 300 MG CAPSULE    Take 1,200 mg by mouth 2 (two) times daily.   GLIMEPIRIDE (AMARYL) 4 MG TABLET    Take 4 mg by mouth 2 (two) times daily.   HYDRALAZINE (APRESOLINE) 25 MG TABLET    Take 25 mg by mouth 2 (two) times daily.   IBUPROFEN (ADVIL,MOTRIN) 200 MG TABLET    Take 200 mg by mouth every 6 (six) hours as needed.   INSULIN GLARGINE (LANTUS) 100 UNIT/ML INJECTION    Inject 25 Units into the skin at bedtime.   LOSARTAN (COZAAR) 25 MG TABLET    Take 25 mg by mouth daily.   METFORMIN (GLUCOPHAGE) 500 MG TABLET    Take 500 mg by mouth 2 (two) times daily with a meal.   MIRABEGRON ER (MYRBETRIQ) 50 MG TB24 TABLET    Take 50 mg by mouth every evening.   OXYCODONE-ACETAMINOPHEN (PERCOCET/ROXICET) 5-325 MG TABLET    Take 1 tablet by mouth every 6 (six) hours.   PROPRANOLOL (INDERAL) 80 MG TABLET    Take 80 mg by mouth 2 (two) times daily.   SITAGLIPTIN (JANUVIA) 100 MG  TABLET    Take 50 mg by mouth daily.   URSODIOL (ACTIGALL) 250 MG TABLET    Take 250 mg by mouth 2 (two) times daily.  Modified Medications   No medications on file  Discontinued Medications   No medications on file     Allergies  Allergen Reactions  . Byetta 10 Mcg Pen [Exenatide] Nausea And Vomiting  . Codeine Nausea And Vomiting  . Sulfa Antibiotics Other (See Comments)    Weird feeling  . Tramadol Other (See Comments)    headache  . Ace Inhibitors Cough  . Adhesive [Tape] Itching  . Iodinated Diagnostic Agents Rash  . Latex Itching     REVIEW OF SYSTEMS:  GENERAL: no change in appetite, no fatigue, no weight changes, no fever, chills or weakness EYES: Denies change in vision, dry eyes, eye pain, itching or discharge EARS: Denies change in  hearing, ringing in ears, or earache NOSE: Denies nasal congestion or epistaxis MOUTH and THROAT: Denies oral discomfort, gingival pain or bleeding, pain from teeth or hoarseness   RESPIRATORY: no cough, SOB, DOE, wheezing, hemoptysis CARDIAC: no chest pain, edema or palpitations GI: no abdominal pain, diarrhea, constipation, heart burn, nausea or vomiting GU: Denies dysuria, frequency, hematuria, incontinence, or discharge PSYCHIATRIC: Denies feeling of depression or anxiety. No report of hallucinations, insomnia, paranoia, or agitation   PHYSICAL EXAMINATION  GENERAL APPEARANCE: Well nourished. In no acute distress. Normal body habitus SKIN:  Right knee with 3 sites (S/P arthroscopy), dry, no erythema HEAD: Normal in size and contour. No evidence of trauma EYES: Lids open and close normally. No blepharitis, entropion or ectropion. PERRL. Conjunctivae are clear and sclerae are white. Lenses are without opacity EARS: Pinnae are normal. Patient hears normal voice tunes of the examiner MOUTH and THROAT: Lips are without lesions. Oral mucosa is moist and without lesions. Tongue is normal in shape, size, and color and without lesions NECK: supple, trachea midline, no neck masses, no thyroid tenderness, no thyromegaly LYMPHATICS: no LAN in the neck, no supraclavicular LAN RESPIRATORY: breathing is even & unlabored, BS CTAB CARDIAC: RRR, no murmur,no extra heart sounds, no edema GI: abdomen soft, normal BS, no masses, no tenderness, no hepatomegaly, no splenomegaly EXTREMITIES:  Able to move 4 extremities PSYCHIATRIC: Alert and oriented X 3. Affect and behavior are appropriate  LABS/RADIOLOGY: Labs reviewed: Basic Metabolic Panel:  Recent Labs  16/02/9611/06/16 1945 04/10/15 0920 04/11/15 0307  NA 137 140 141  K 4.9 4.3 3.7  CL 102 108 105  CO2 25 25 28   GLUCOSE 69 159* 148*  BUN 15 13 10   CREATININE 1.53* 1.21* 1.11*  CALCIUM 8.6* 8.0* 8.7*   Liver Function Tests:  Recent Labs   04/09/15 1945  AST 49*  ALT 45  ALKPHOS 101  BILITOT 0.5  PROT 6.4*  ALBUMIN 3.2*   CBC:  Recent Labs  04/09/15 1945 04/10/15 0920 04/11/15 0307  WBC 13.1* 9.2 7.5  NEUTROABS 7.7 5.9  --   HGB 10.9* 9.9* 9.6*  HCT 37.2 33.4* 32.1*  MCV 75.3* 74.7* 73.6*  PLT 242 219 208   CBG:  Recent Labs  04/11/15 0747 04/11/15 1118 04/11/15 1612  GLUCAP 156* 207* 177*    X-ray Chest Pa And Lateral  04/10/2015  CLINICAL DATA:  Shortness of breath and difficulty breathing following knee surgery. EXAM: CHEST  2 VIEW COMPARISON:  04/09/2015 FINDINGS: Heart remains enlarged. Mediastinal shadows are unremarkable. There is mild atelectasis at the lung bases. No lobar collapse. Question venous hypertension.  No frank edema. No effusions. IMPRESSION: Mild volume loss at the lung bases. Question mild venous hypertension. Electronically Signed   By: Paulina Fusi M.D.   On: 04/10/2015 12:33   Dg Chest 2 View  04/09/2015  CLINICAL DATA:  Acute onset of cough. Weakness and tiredness. Recent knee surgery. Initial encounter. EXAM: CHEST  2 VIEW COMPARISON:  None. FINDINGS: The lungs are well-aerated. Mild vascular congestion is noted. There is no evidence of focal opacification, pleural effusion or pneumothorax. The heart is borderline normal in size. No acute osseous abnormalities are seen. IMPRESSION: Mild vascular congestion noted.  Lungs remain grossly clear. Electronically Signed   By: Roanna Raider M.D.   On: 04/09/2015 20:18    ASSESSMENT/PLAN:  Generalized weakness - for rehabilitation  Osteoarthritis S/P arthroscopy - continue Motrin 200 mg 1 tab by mouth every 6 hours when necessary and Percocet 5/325 mg 1 tab by mouth every 6 hours when necessary for pain; follow-up with Dr. Ophelia Charter, orthopedic surgeon, on 12/13  AKI - creatinine 1.11; check BMP  Hypertension - continue Norvasc 5 mg 1 tab by mouth daily, Inderal 80 mg 1 tab by mouth twice a day and hydralazine 25 mg 1 tab by mouth twice a  day  Diabetes mellitus, type II - continue Sitagliptin 50 mg 1 tab by mouth daily, glimepiride 4 mg 1 tab by mouth twice a day, Lantus 25 units subcutaneous daily at bedtime and metformin 500 mg 1 tab by mouth twice a day  Neuropathy - continue Neurontin 1200 mg by mouth twice a day  Iron deficiency anemia - hemoglobin 9.6; check CBC  Anxiety - mood this is stable; continue Tranxene 15 mg 1 tab by mouth twice a day  Constipation - continue Colace 100 mg 1 capsule by mouth daily  GERD - continue Nexium 40 mg 1 capsule by mouth @ 12 noon  OAB - continue Myrbetriq 50 mg every 4 hour 1 tab by mouth daily  Hyperlipidemia - continue Actigall 250 mg by mouth twice a day  Protein calorie malnutrition - albumen 3.2; start Procel 1 scoop by mouth twice a day  OSA - continue CPAP @ HS      Goals of care:  Short-term rehabilitation   [facility], NP [facility] [phone]

## 2015-04-22 DIAGNOSIS — E1140 Type 2 diabetes mellitus with diabetic neuropathy, unspecified: Secondary | ICD-10-CM | POA: Diagnosis not present

## 2015-04-22 DIAGNOSIS — M1711 Unilateral primary osteoarthritis, right knee: Secondary | ICD-10-CM | POA: Diagnosis not present

## 2015-04-22 DIAGNOSIS — D509 Iron deficiency anemia, unspecified: Secondary | ICD-10-CM | POA: Diagnosis not present

## 2015-04-22 DIAGNOSIS — I10 Essential (primary) hypertension: Secondary | ICD-10-CM | POA: Diagnosis not present

## 2015-04-22 DIAGNOSIS — M6281 Muscle weakness (generalized): Secondary | ICD-10-CM | POA: Diagnosis not present

## 2015-04-22 DIAGNOSIS — I2510 Atherosclerotic heart disease of native coronary artery without angina pectoris: Secondary | ICD-10-CM | POA: Diagnosis not present

## 2016-05-05 IMAGING — CR DG CHEST 2V
2 series · 2 of 2 positions shown · non-contrast
Comparison: 04/09/2015

CLINICAL DATA: Shortness of breath and difficulty breathing
following knee surgery.

EXAM:
CHEST  2 VIEW

[chest pa]
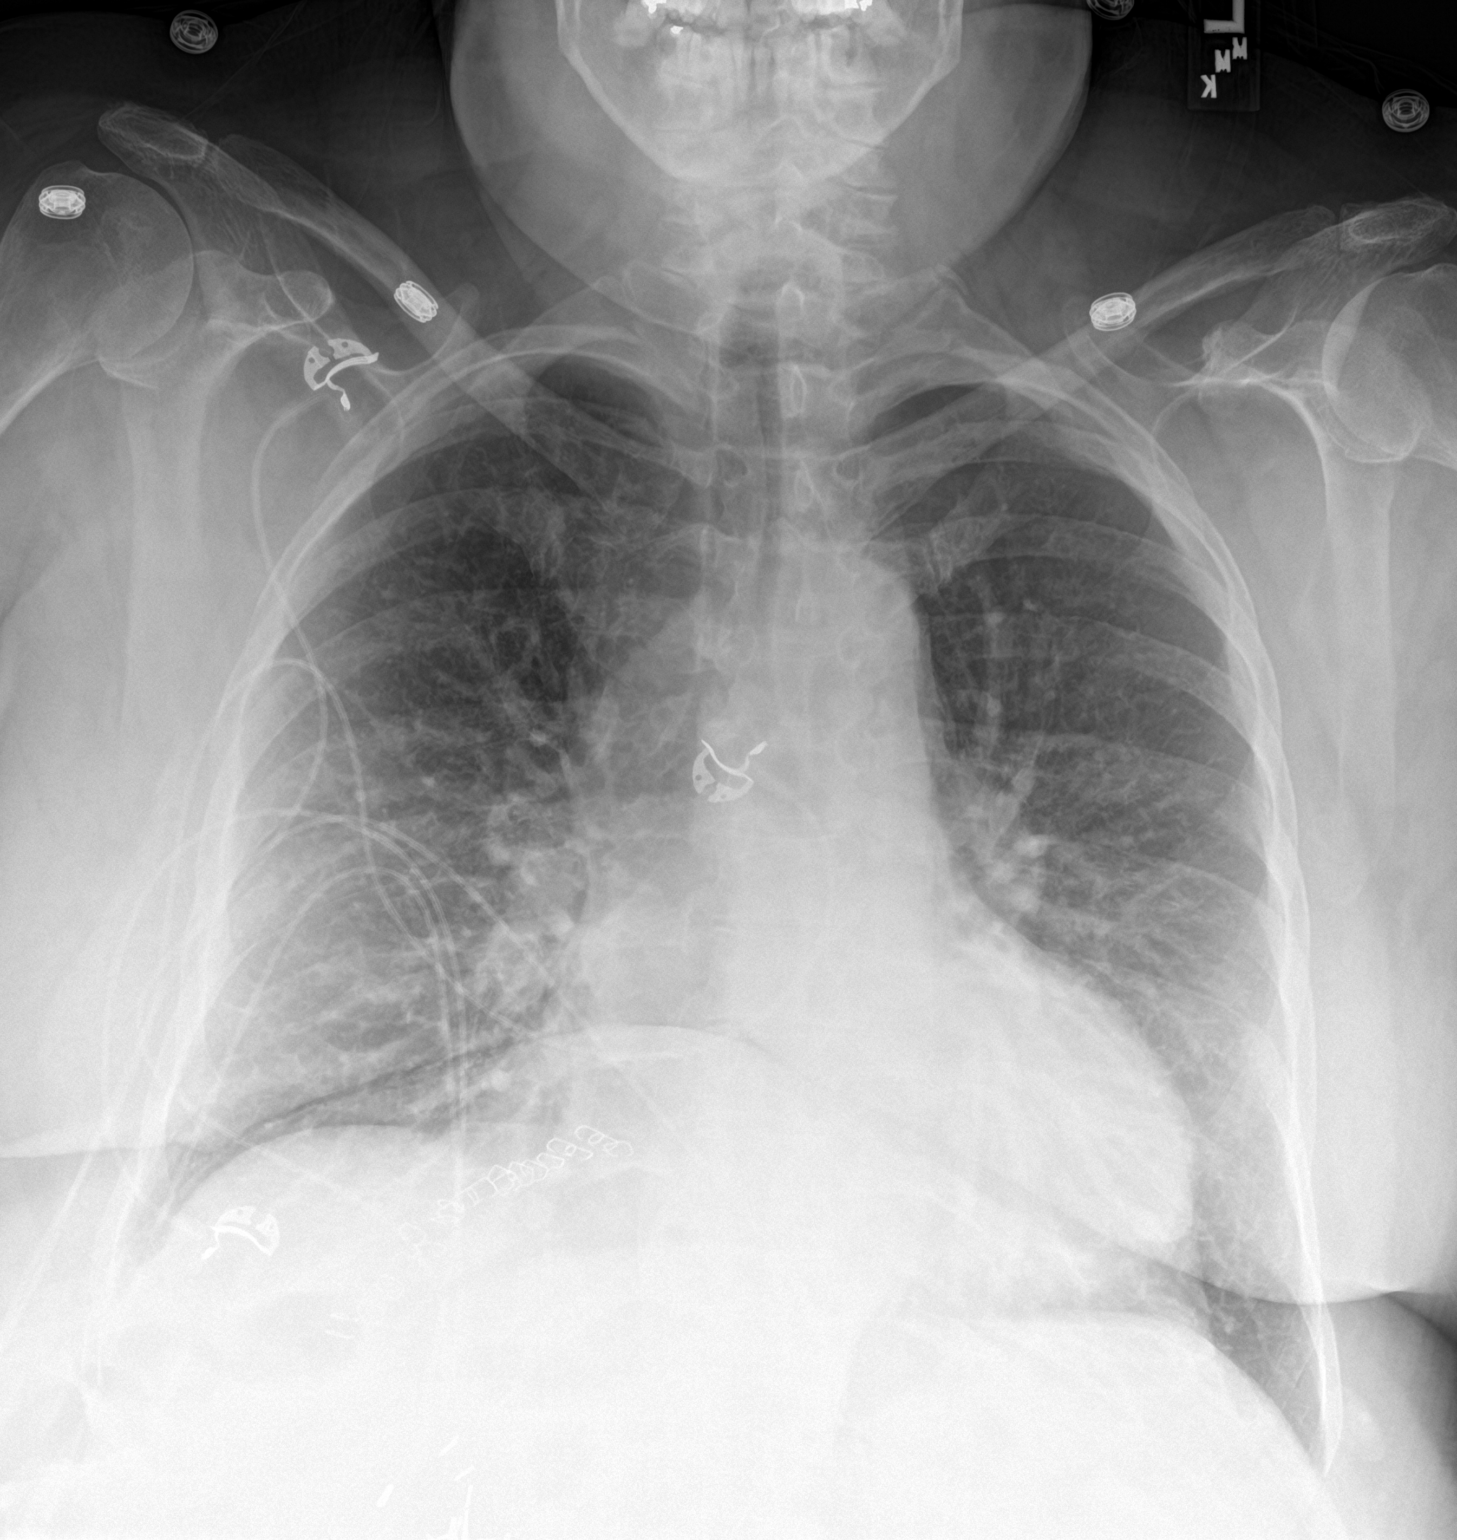

[chest lat]
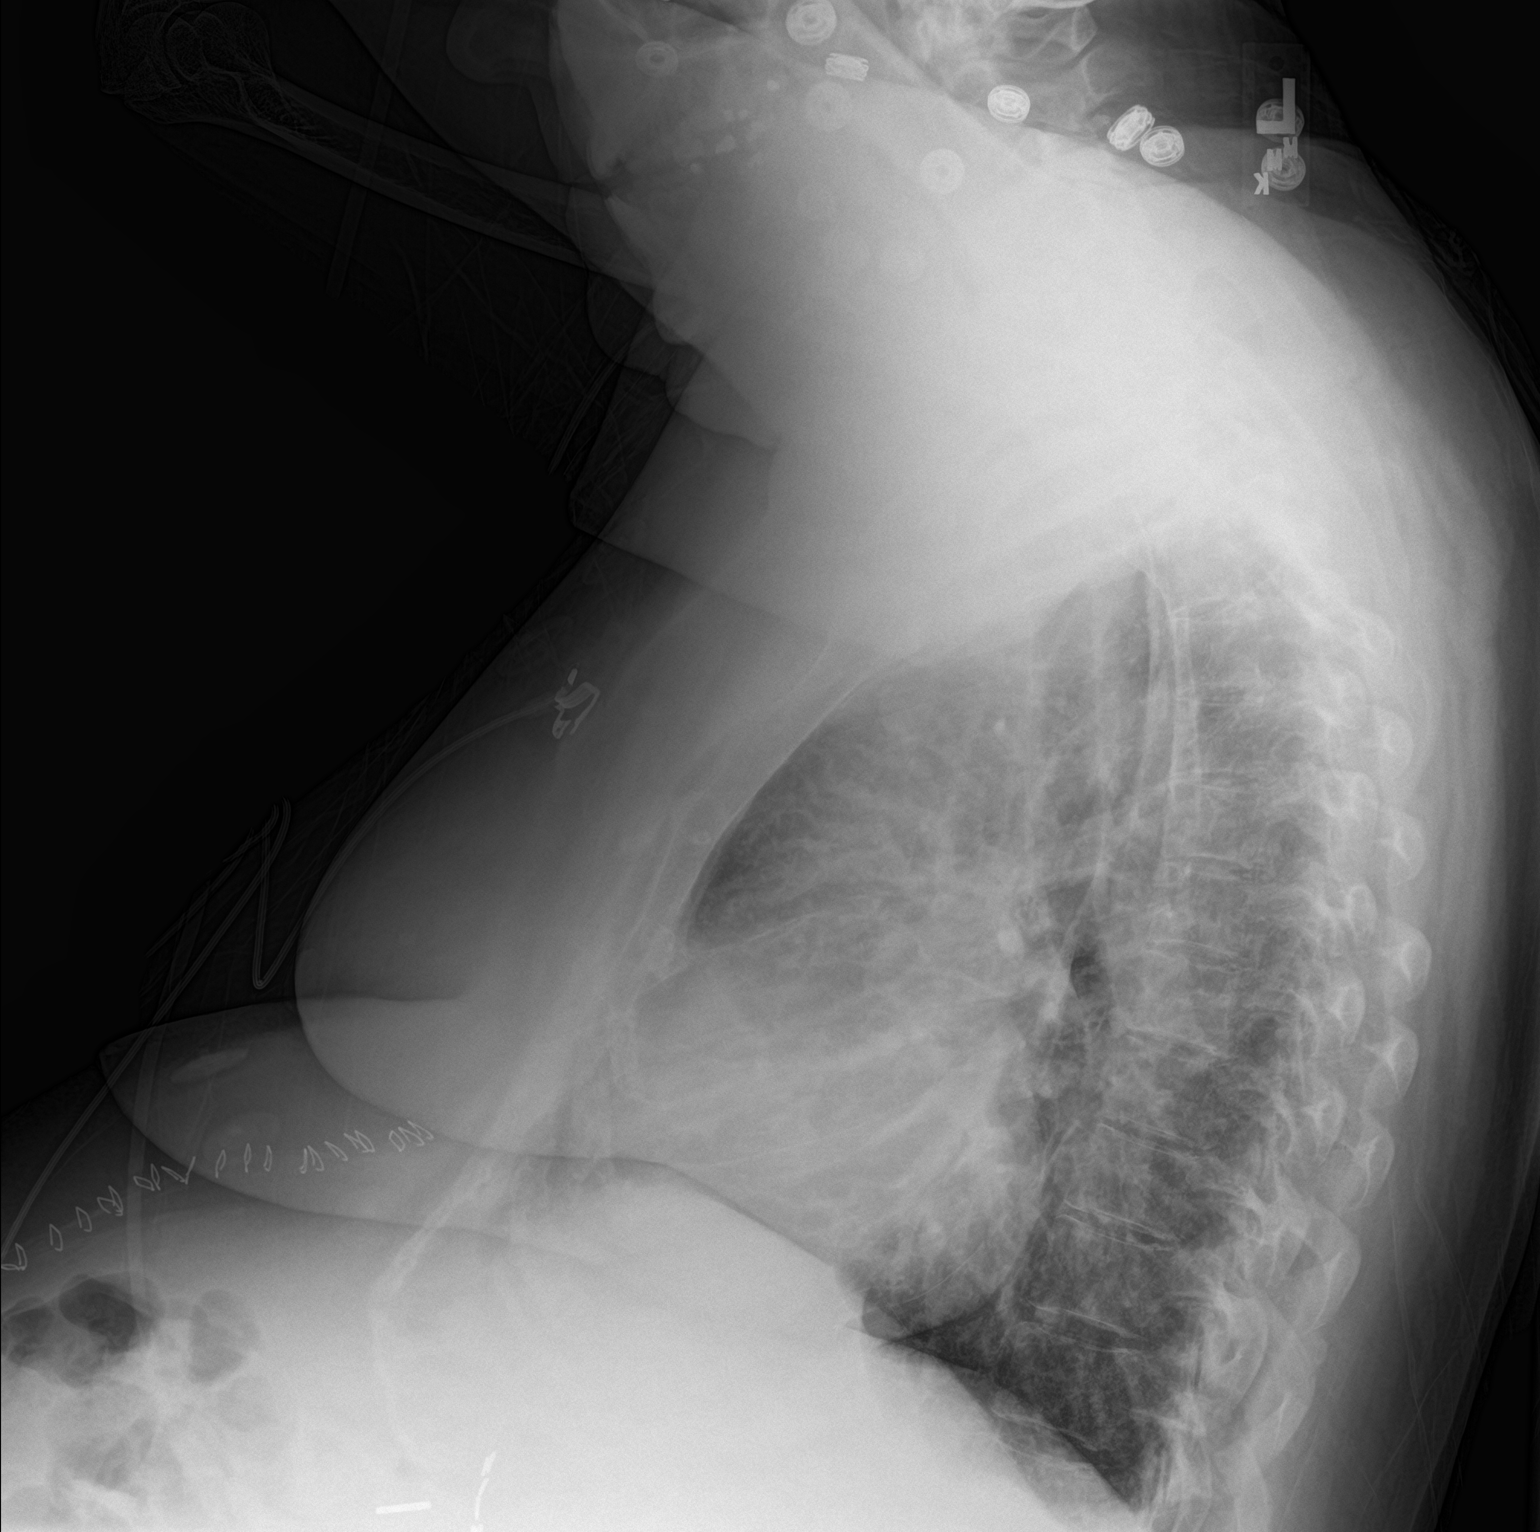

[2 of 2 positions shown; findings below may reference images not displayed]

FINDINGS: Heart remains enlarged. Mediastinal shadows are unremarkable. There
is mild atelectasis at the lung bases. No lobar collapse. Question
venous hypertension. No frank edema. No effusions.
IMPRESSION: Mild volume loss at the lung bases. Question mild venous
hypertension.
